# Patient Record
Sex: Female | Born: 1950 | Hispanic: No | Smoking: Never smoker
Health system: Southern US, Community
[De-identification: ages and names within clinical notes are randomized; demographics above are authoritative.]

---

## 2016-03-04 DIAGNOSIS — R05 Cough: Secondary | ICD-10-CM | POA: Diagnosis not present

## 2018-02-18 ENCOUNTER — Observation Stay (HOSPITAL_COMMUNITY)
Admission: EM | Admit: 2018-02-18 | Discharge: 2018-02-20 | Disposition: A | Discharge status: Home / Self Care | Payer: Medicare Other | Attending: Surgery | Admitting: Surgery

## 2018-02-18 ENCOUNTER — Emergency Department (HOSPITAL_COMMUNITY): Payer: Medicare Other

## 2018-02-18 ENCOUNTER — Other Ambulatory Visit: Payer: Self-pay

## 2018-02-18 ENCOUNTER — Encounter: Payer: Self-pay | Admitting: Emergency Medicine

## 2018-02-18 DIAGNOSIS — K8010 Calculus of gallbladder with chronic cholecystitis without obstruction: Principal | ICD-10-CM | POA: Insufficient documentation

## 2018-02-18 DIAGNOSIS — R1011 Right upper quadrant pain: Secondary | ICD-10-CM | POA: Diagnosis not present

## 2018-02-18 DIAGNOSIS — R109 Unspecified abdominal pain: Secondary | ICD-10-CM | POA: Diagnosis not present

## 2018-02-18 DIAGNOSIS — R1010 Upper abdominal pain, unspecified: Secondary | ICD-10-CM | POA: Diagnosis not present

## 2018-02-18 DIAGNOSIS — K8001 Calculus of gallbladder with acute cholecystitis with obstruction: Secondary | ICD-10-CM

## 2018-02-18 DIAGNOSIS — K8000 Calculus of gallbladder with acute cholecystitis without obstruction: Secondary | ICD-10-CM | POA: Diagnosis present

## 2018-02-18 DIAGNOSIS — K810 Acute cholecystitis: Secondary | ICD-10-CM | POA: Diagnosis present

## 2018-02-18 DIAGNOSIS — K819 Cholecystitis, unspecified: Secondary | ICD-10-CM

## 2018-02-18 DIAGNOSIS — K8020 Calculus of gallbladder without cholecystitis without obstruction: Secondary | ICD-10-CM | POA: Diagnosis not present

## 2018-02-18 LAB — COMPREHENSIVE METABOLIC PANEL
ALT: 144 U/L — ABNORMAL HIGH (ref 0–44)
AST: 94 U/L — ABNORMAL HIGH (ref 15–41)
Albumin: 3.8 g/dL (ref 3.5–5.0)
Alkaline Phosphatase: 153 U/L — ABNORMAL HIGH (ref 38–126)
Anion gap: 9 (ref 5–15)
BUN: 14 mg/dL (ref 8–23)
CO2: 26 mmol/L (ref 22–32)
Calcium: 8.8 mg/dL — ABNORMAL LOW (ref 8.9–10.3)
Chloride: 103 mmol/L (ref 98–111)
Creatinine, Ser: 0.72 mg/dL (ref 0.44–1.00)
GFR calc Af Amer: >60 mL/min (ref >60)
Glucose, Bld: 100 mg/dL — ABNORMAL HIGH (ref 70–99)
Potassium: 3.7 mmol/L (ref 3.5–5.1)
Sodium: 138 mmol/L (ref 135–145)
Total Bilirubin: 0.8 mg/dL (ref 0.3–1.2)
Total Protein: 7.4 g/dL (ref 6.5–8.1)

## 2018-02-18 LAB — URINALYSIS, ROUTINE W REFLEX MICROSCOPIC
Bacteria, UA: NONE SEEN
Bilirubin Urine: NEGATIVE
Glucose, UA: NEGATIVE mg/dL
Ketones, ur: NEGATIVE mg/dL
Nitrite: NEGATIVE
Protein, ur: NEGATIVE mg/dL
SPECIFIC GRAVITY, URINE: 1.021 (ref 1.005–1.030)
pH: 5 (ref 5.0–8.0)

## 2018-02-18 LAB — CBC
HCT: 39.1 % (ref 36.0–46.0)
Hemoglobin: 12.2 g/dL (ref 12.0–15.0)
MCH: 28.9 pg (ref 26.0–34.0)
MCHC: 31.2 g/dL (ref 30.0–36.0)
MCV: 92.7 fL (ref 80.0–100.0)
Platelets: 215 10*3/uL (ref 150–400)
RBC: 4.22 MIL/uL (ref 3.87–5.11)
RDW: 12.9 % (ref 11.5–15.5)
WBC: 8.1 10*3/uL (ref 4.0–10.5)
nRBC: 0 % (ref 0.0–0.2)

## 2018-02-18 LAB — LIPASE, BLOOD: Lipase: 34 U/L (ref 11–51)

## 2018-02-18 MED ORDER — HYDROCODONE-ACETAMINOPHEN 5-325 MG PO TABS: 1.0000 | ORAL_TABLET | ORAL | Status: DC | PRN

## 2018-02-18 MED ORDER — KETOROLAC TROMETHAMINE 30 MG/ML IJ SOLN
30.0000 mg | Freq: Once | INTRAMUSCULAR | Status: AC
  Administered 2018-02-18: 30 mg via INTRAVENOUS
  Filled 2018-02-18: qty 1

## 2018-02-18 MED ORDER — MORPHINE SULFATE (PF) 4 MG/ML IV SOLN
4.0000 mg | Freq: Once | INTRAVENOUS | Status: DC
  Filled 2018-02-18 (×2): qty 1

## 2018-02-18 MED ORDER — ACETAMINOPHEN 650 MG RE SUPP: 650.0000 mg | Freq: Four times a day (QID) | RECTAL | Status: DC | PRN

## 2018-02-18 MED ORDER — ACETAMINOPHEN 325 MG PO TABS: 650.0000 mg | ORAL_TABLET | Freq: Four times a day (QID) | ORAL | Status: DC | PRN

## 2018-02-18 MED ORDER — ONDANSETRON HCL 4 MG/2ML IJ SOLN
4.0000 mg | Freq: Once | INTRAMUSCULAR | Status: AC
  Administered 2018-02-18: 4 mg via INTRAVENOUS
  Filled 2018-02-18 (×3): qty 2

## 2018-02-18 MED ORDER — KCL IN DEXTROSE-NACL 20-5-0.45 MEQ/L-%-% IV SOLN
INTRAVENOUS | Status: DC
  Administered 2018-02-18: via INTRAVENOUS
  Filled 2018-02-18: qty 1000

## 2018-02-18 MED ORDER — ONDANSETRON HCL 4 MG/2ML IJ SOLN: 4.0000 mg | Freq: Four times a day (QID) | INTRAMUSCULAR | Status: DC | PRN

## 2018-02-18 MED ORDER — SODIUM CHLORIDE 0.9% FLUSH
3.0000 mL | Freq: Once | INTRAVENOUS | Status: AC
  Administered 2018-02-18: 3 mL via INTRAVENOUS

## 2018-02-18 MED ORDER — SODIUM CHLORIDE 0.9 % IV SOLN
2.0000 g | Freq: Every day | INTRAVENOUS | Status: DC
  Administered 2018-02-19: 2 g via INTRAVENOUS
  Filled 2018-02-18: qty 2
  Filled 2018-02-18: qty 20

## 2018-02-18 MED ORDER — ONDANSETRON 4 MG PO TBDP: 4.0000 mg | ORAL_TABLET | Freq: Four times a day (QID) | ORAL | Status: DC | PRN

## 2018-02-18 MED ORDER — HYDROMORPHONE HCL 1 MG/ML IJ SOLN: 1.0000 mg | INTRAMUSCULAR | Status: DC | PRN

## 2018-02-18 NOTE — ED Provider Notes (Signed)
[facility] DEPT Provider Note   CSN: [id number] Arrival date & time: 02/18/18  1715     History   Chief Complaint Chief Complaint  Patient presents with  . Abdominal Pain    HPI Daisy Page is a 68 y.o. female presents sent from her PCP for evaluation of acute onset, progressively worsening right-sided abdominal pain for 3 days.  Reports sharp stabbing pain which worsens with any deep inspiration, cough, or movements.  Pain does not radiate.  Has had nausea and vomiting in the past couple of days.  Reports that nausea and vomiting is not unusual for her as she sometimes "gets backed up".  Last bowel movement was earlier today and was normal for her.  Denies urinary symptoms, melena, hematochezia, diarrhea.  Has been taking ibuprofen without relief of her symptoms.  Also notes URI symptoms and nonproductive cough for the last week or so which has been improving.  Went to [facility] and was sent to the ED for rule out of cholecystitis.  The history is provided by the patient.    History reviewed. No pertinent past medical history.  Patient Active Problem List   Diagnosis Date Noted  . Acute calculous cholecystitis 02/18/2018  . Acute cholecystitis 02/18/2018    History reviewed. No pertinent surgical history.   OB History   No obstetric history on file.      Home Medications    Prior to Admission medications   Not on File    Family History Family History  Problem Relation Age of Onset  . Thyroid disease Mother   . Diabetes Mother   . Diabetes Father   . Hypertension Father     Social History Social History   Tobacco Use  . Smoking status: Never Smoker  . Smokeless tobacco: Never Used  Substance Use Topics  . Alcohol use: Never    Frequency: Never  . Drug use: Never     Allergies   Patient has no known allergies.   Review of Systems Review of Systems  Constitutional: Negative for chills and fever.    Respiratory: Positive for cough. Negative for shortness of breath.   Cardiovascular: Negative for chest pain.  Gastrointestinal: Positive for abdominal pain, nausea and vomiting. Negative for blood in stool and diarrhea.  Genitourinary: Negative for dysuria, frequency, hematuria and urgency.  All other systems reviewed and are negative.    Physical Exam Updated Vital Signs BP (!) 148/72 (BP Location: Right Arm)   Pulse 70   Temp 98.1 F (36.7 C) (Oral)   Resp 20   Ht _0  (1.549 m)   Wt 91 kg   SpO2 97%   BMI 37.90 kg/m   Physical Exam Vitals signs and nursing note reviewed.  Constitutional:      General: She is not in acute distress.    Appearance: She is well-developed.  HENT:     Head: Normocephalic and atraumatic.  Eyes:     General:        Right eye: No discharge.        Left eye: No discharge.     Conjunctiva/sclera: Conjunctivae normal.  Neck:     Vascular: No JVD.     Trachea: No tracheal deviation.  Cardiovascular:     Rate and Rhythm: Normal rate and regular rhythm.  Pulmonary:     Effort: Pulmonary effort is normal.     Breath sounds: Normal breath sounds.  Chest:     Chest wall:  No tenderness.  Abdominal:     General: There is no distension.     Palpations: Abdomen is soft.     Tenderness: There is abdominal tenderness in the right upper quadrant. There is guarding. There is no right CVA tenderness, left CVA tenderness or rebound. Positive signs include Murphy's sign. Negative signs include Rovsing's sign and McBurney's sign.     Comments: Right flank tender to palpation  Skin:    General: Skin is warm and dry.     Findings: No erythema.  Neurological:     Mental Status: She is alert.  Psychiatric:        Behavior: Behavior normal.      ED Treatments / Results  Labs (all labs ordered are listed, but only abnormal results are displayed) Labs Reviewed  COMPREHENSIVE METABOLIC PANEL - Abnormal; Notable for the following components:       Result Value   Glucose, Bld 100 (*)    Calcium 8.8 (*)    AST 94 (*)    ALT 144 (*)    Alkaline Phosphatase 153 (*)    All other components within normal limits  URINALYSIS, ROUTINE W REFLEX MICROSCOPIC - Abnormal; Notable for the following components:   APPearance HAZY (*)    Hgb urine dipstick SMALL (*)    Leukocytes, UA TRACE (*)    All other components within normal limits  MRSA PCR SCREENING  LIPASE, BLOOD  CBC  HIV ANTIBODY (ROUTINE TESTING W REFLEX)  COMPREHENSIVE METABOLIC PANEL  CBC  PROTIME-INR    EKG None  Radiology Dg Chest 2 View  Result Date: 02/18/2018 CLINICAL DATA:  Upper abdominal pain for 3 days. EXAM: CHEST - 2 VIEW COMPARISON:  None. FINDINGS: Cardiomediastinal silhouette is normal. No pleural effusions or focal consolidations. Trachea projects midline and there is no pneumothorax. Soft tissue planes and included osseous structures are non-suspicious. Mild thoracic spondylosis and scoliosis. IMPRESSION: No acute cardiopulmonary process. Electronically Signed   By: Elon Alas M.D.   On: 02/18/2018 19:15   US Abdomen Limited Ruq  Result Date: 02/18/2018 CLINICAL DATA:  Right upper quadrant abdominal pain. EXAM: ULTRASOUND ABDOMEN LIMITED RIGHT UPPER QUADRANT COMPARISON:  None. FINDINGS: Gallbladder: Multiple stones within the gallbladder lumen. Gallbladder wall thickening. Small amount of pericholecystic fluid. Focal area of ring down artifact. Common bile duct: Diameter: 3 mm Liver: No focal lesion identified. Within normal limits in parenchymal echogenicity. Portal vein is patent on color Doppler imaging with normal direction of blood flow towards the liver. IMPRESSION: Cholelithiasis. Mild gallbladder wall thickening. Acute cholecystitis not excluded. Consider further evaluation with HIDA scan. Electronically Signed   By: Lovey Newcomer M.D.   On: 02/18/2018 18:56    Procedures Procedures (including critical care time)  Medications Ordered in  ED Medications  dextrose 5 % and 0.45 % NaCl with KCl 20 mEq/L infusion ( Intravenous New Bag/Given 02/18/18 2347)  cefTRIAXone (ROCEPHIN) 2 g in sodium chloride 0.9 % 100 mL IVPB (has no administration in time range)  acetaminophen (TYLENOL) tablet 650 mg (has no administration in time range)    Or  acetaminophen (TYLENOL) suppository 650 mg (has no administration in time range)  HYDROcodone-acetaminophen (NORCO/VICODIN) 5-325 MG per tablet 1-2 tablet (has no administration in time range)  HYDROmorphone (DILAUDID) injection 1 mg (has no administration in time range)  ondansetron (ZOFRAN-ODT) disintegrating tablet 4 mg (has no administration in time range)    Or  ondansetron (ZOFRAN) injection 4 mg (has no administration in time range)  sodium  chloride flush (NS) 0.9 % injection 3 mL (3 mLs Intravenous Given 02/18/18 1938)  ondansetron (ZOFRAN) injection 4 mg (4 mg Intravenous Given 02/18/18 2027)  ketorolac (TORADOL) 30 MG/ML injection 30 mg (30 mg Intravenous Given 02/18/18 2027)     Initial Impression / Assessment and Plan / ED Course  I have reviewed the triage vital signs and the nursing notes.  Pertinent labs & imaging results that were available during my care of the patient were reviewed by me and considered in my medical decision making (see chart for details).     Patient with right upper quadrant abdominal pain, nausea vomiting presents sent from her primary care provider for evaluation and rule out of cholelithiasis.  She is afebrile, initially hypertensive in the ED with some improvement.  She is quite uncomfortable but nontoxic in appearance.  Positive Murphy's on examination.  Chest x-ray shows no acute cardiopulmonary disease.  Doubt pneumonia, PE, ACS/MI.  Lab work reviewed by me shows elevated LFTs with AST 94 ALT 144 alk phosphatase 153 no renal insufficiency.  UA does not suggest UTI or nephrolithiasis.  No leukocytosis.  Lipase within normal limits.  Right upper quadrant  ultrasound shows cholelithiasis with mild gallbladder wall thickening.  Concern for acute cholecystitis.  Doubt obstruction, perforation, appendicitis, or dissection.  Will give IV antibiotics in the ED.  Spoke with Dr. Harlow Asa with general surgery who agrees to assume care of patient and bring her into the hospital for further evaluation and management.  Final Clinical Impressions(s) / ED Diagnoses   Final diagnoses:  RUQ pain  Calculus of gallbladder with acute cholecystitis and obstruction    ED Discharge Orders    None       Debroah Baller 02/19/18 Prudence Davidson, MD 02/21/18 [phone]

## 2018-02-18 NOTE — ED Notes (Signed)
ED TO INPATIENT HANDOFF REPORT  Name/Age/Gender Daisy CobiaPamela S Page 68 y.o. female  Code Status    Code Status Orders  (From admission, onward)         Start     Ordered   02/18/18 2217  Full code  Continuous     02/18/18 2219        Code Status History    This patient has a current code status but no historical code status.      Home/SNF/Other Home  Chief Complaint rt side pain / [facility]  Level of Care/Admitting Diagnosis ED Disposition    ED Disposition Condition Comment   Admit  [facility] [[id number]]  Level of Care: Med-Surg [16]  Diagnosis: Acute cholecystitis [575.0.ICD-9-CM]  Admitting Physician: CCS, MD [[id number]]  Attending Physician: CCS, MD [[id number]]  Bed request comments: 5 west  PT Class (Do Not Modify): Observation [104]  PT Acc Code (Do Not Modify): Observation [10022]       Medical History History reviewed. No pertinent past medical history.  Allergies No Known Allergies  IV Location/Drains/Wounds Patient Lines/Drains/Airways Status   Active Line/Drains/Airways    Name:   Placement date:   Placement time:   Site:   Days:   Peripheral IV 02/18/18 Left Antecubital   02/18/18    1936    Antecubital   less than 1          Labs/Imaging Results for orders placed or performed during the hospital encounter of 02/18/18 (from the past 48 hour(s))  Urinalysis, Routine w reflex microscopic     Status: Abnormal   Collection Time: 02/18/18  6:05 PM  Result Value Ref Range   Color, Urine YELLOW YELLOW   APPearance HAZY (A) CLEAR   Specific Gravity, Urine 1.021 1.005 - 1.030   pH 5.0 5.0 - 8.0   Glucose, UA NEGATIVE NEGATIVE mg/dL   Hgb urine dipstick SMALL (A) NEGATIVE   Bilirubin Urine NEGATIVE NEGATIVE   Ketones, ur NEGATIVE NEGATIVE mg/dL   Protein, ur NEGATIVE NEGATIVE mg/dL   Nitrite NEGATIVE NEGATIVE   Leukocytes, UA TRACE (A) NEGATIVE   RBC / HPF 0-5 0 - 5 RBC/hpf   WBC, UA 0-5 0 - 5 WBC/hpf   Bacteria,  UA NONE SEEN NONE SEEN   Squamous Epithelial / LPF 0-5 0 - 5   Mucus PRESENT     Comment: Performed at [facility], [address] [place], [place] [address]  Lipase, blood     Status: None   Collection Time: 02/18/18  7:37 PM  Result Value Ref Range   Lipase 34 11 - 51 U/L    Comment: Performed at [facility], [address] [place], [place] [address]  Comprehensive metabolic panel     Status: Abnormal   Collection Time: 02/18/18  7:37 PM  Result Value Ref Range   Sodium 138 135 - 145 mmol/L   Potassium 3.7 3.5 - 5.1 mmol/L   Chloride 103 98 - 111 mmol/L   CO2 26 22 - 32 mmol/L   Glucose, Bld 100 (H) 70 - 99 mg/dL   BUN 14 8 - 23 mg/dL   Creatinine, Ser 0.980.72 0.44 - 1.00 mg/dL   Calcium 8.8 (L) 8.9 - 10.3 mg/dL   Total Protein 7.4 6.5 - 8.1 g/dL   Albumin 3.8 3.5 - 5.0 g/dL   AST 94 (H) 15 - 41 U/L   ALT 144 (H) 0 - 44 U/L  Alkaline Phosphatase 153 (H) 38 - 126 U/L   Total Bilirubin 0.8 0.3 - 1.2 mg/dL   GFR calc non Af Amer >60 >60 mL/min   GFR calc Af Amer >60 >60 mL/min   Anion gap 9 5 - 15    Comment: Performed at [facility], [address] [place], [place] [address]  CBC     Status: None   Collection Time: 02/18/18  7:37 PM  Result Value Ref Range   WBC 8.1 4.0 - 10.5 K/uL   RBC 4.22 3.87 - 5.11 MIL/uL   Hemoglobin 12.2 12.0 - 15.0 g/dL   HCT 21.339.1 08.636.0 - 57.846.0 %   MCV 92.7 80.0 - 100.0 fL   MCH 28.9 26.0 - 34.0 pg   MCHC 31.2 30.0 - 36.0 g/dL   RDW 46.912.9 62.911.5 - 52.815.5 %   Platelets 215 150 - 400 K/uL   nRBC 0.0 0.0 - 0.2 %    Comment: Performed at [facility], [address] [place], [place] [address]   Dg Chest 2 View  Result Date: 02/18/2018 CLINICAL DATA:  Upper abdominal pain for 3 days. EXAM: CHEST - 2 VIEW COMPARISON:  None. FINDINGS: Cardiomediastinal silhouette is normal. No pleural effusions or focal consolidations. Trachea projects midline and there is no pneumothorax. Soft  tissue planes and included osseous structures are non-suspicious. Mild thoracic spondylosis and scoliosis. IMPRESSION: No acute cardiopulmonary process. Electronically Signed   By: Awilda Metroourtnay  Bloomer M.D.   On: 02/18/2018 19:15   [place] Abdomen Limited Ruq  Result Date: 02/18/2018 CLINICAL DATA:  Right upper quadrant abdominal pain. EXAM: ULTRASOUND ABDOMEN LIMITED RIGHT UPPER QUADRANT COMPARISON:  None. FINDINGS: Gallbladder: Multiple stones within the gallbladder lumen. Gallbladder wall thickening. Small amount of pericholecystic fluid. Focal area of ring down artifact. Common bile duct: Diameter: 3 mm Liver: No focal lesion identified. Within normal limits in parenchymal echogenicity. Portal vein is patent on color Doppler imaging with normal direction of blood flow towards the liver. IMPRESSION: Cholelithiasis. Mild gallbladder wall thickening. Acute cholecystitis not excluded. Consider further evaluation with HIDA scan. Electronically Signed   By: Annia Beltrew  Davis M.D.   On: 02/18/2018 18:56    Pending Labs Unresulted Labs (From admission, onward)    Start     Ordered   02/19/18 0500  Comprehensive metabolic panel  Tomorrow morning,   R     02/18/18 2219   02/19/18 0500  CBC  Tomorrow morning,   R     02/18/18 2219   02/19/18 0500  Protime-INR  Tomorrow morning,   R     02/18/18 2219   02/18/18 2217  HIV antibody (Routine Testing)  Once,   R     02/18/18 2219          Vitals/Pain Today's Vitals   02/18/18 1723 02/18/18 1827 02/18/18 1930 02/18/18 2000  BP:  (!) 166/80  (!) 173/63  Pulse:  75  68  Resp:  18  17  Temp:      TempSrc:      SpO2:  95%  96%  PainSc: 10-Worst pain ever  9  9     Isolation Precautions No active isolations  Medications Medications  dextrose 5 % and 0.45 % NaCl with KCl 20 mEq/L infusion (has no administration in time range)  cefTRIAXone (ROCEPHIN) 2 g in sodium chloride 0.9 % 100 mL IVPB (has no administration in time range)  acetaminophen (TYLENOL) tablet  650 mg (has no administration in time range)  Or  acetaminophen (TYLENOL) suppository 650 mg (has no administration in time range)  HYDROcodone-acetaminophen (NORCO/VICODIN) 5-325 MG per tablet 1-2 tablet (has no administration in time range)  HYDROmorphone (DILAUDID) injection 1 mg (has no administration in time range)  ondansetron (ZOFRAN-ODT) disintegrating tablet 4 mg (has no administration in time range)    Or  ondansetron (ZOFRAN) injection 4 mg (has no administration in time range)  sodium chloride flush (NS) 0.9 % injection 3 mL (3 mLs Intravenous Given 02/18/18 1938)  ondansetron (ZOFRAN) injection 4 mg (4 mg Intravenous Given 02/18/18 2027)  ketorolac (TORADOL) 30 MG/ML injection 30 mg (30 mg Intravenous Given 02/18/18 2027)    Mobility walks

## 2018-02-18 NOTE — H&P (Signed)
Daisy Page is an 68 y.o. female.    General Surgery [facility]  Chief Complaint: abdominal pain, acute cholecystitis, cholelithiasis  HPI: Patient is a 68 year old female who presents to the emergency department on referral from her [facility] care [facility]'s [facility] for evaluation of right upper quadrant abdominal pain.  Patient presents with a 2-day history of lateral right upper quadrant abdominal pain.  She denies fevers or chills.  She denies nausea or vomiting.  She has had recent constipation.  Patient was evaluated in the emergency department.  Laboratory studies showed a normal white blood cell count of 8.1.  There was mild elevation of her transaminases.  Total bilirubin was normal.  Ultrasound exam showed multiple gallstones with thickening of the gallbladder wall and small pericholecystic fluid consistent with acute cholecystitis.  There was no biliary dilatation.  Patient denies any prior history of hepatobiliary disease.  There is no history of hepatitis.  There is no history of pancreatitis.  Patient has had no prior abdominal surgery.  General surgery is now consulted for recommendations for management.  Patient is accompanied by her son.  Primary care physician is Dr. Duane LopeAlan Ross.  History reviewed. No pertinent past medical history.  History reviewed. No pertinent surgical history.  Family History  Problem Relation Age of Onset  . Thyroid disease Mother   . Diabetes Mother   . Diabetes Father   . Hypertension Father    Social History:  reports that she has never smoked. She has never used smokeless tobacco. She reports that she does not drink alcohol or use drugs.  Allergies: Not on File  (Not in a hospital admission)   Results for orders placed or performed during the hospital encounter of 02/18/18 (from the past 48 hour(s))  Urinalysis, Routine w reflex microscopic     Status: Abnormal   Collection Time: 02/18/18  6:05 PM   Result Value Ref Range   Color, Urine YELLOW YELLOW   APPearance HAZY (A) CLEAR   Specific Gravity, Urine 1.021 1.005 - 1.030   pH 5.0 5.0 - 8.0   Glucose, UA NEGATIVE NEGATIVE mg/dL   Hgb urine dipstick SMALL (A) NEGATIVE   Bilirubin Urine NEGATIVE NEGATIVE   Ketones, ur NEGATIVE NEGATIVE mg/dL   Protein, ur NEGATIVE NEGATIVE mg/dL   Nitrite NEGATIVE NEGATIVE   Leukocytes, UA TRACE (A) NEGATIVE   RBC / HPF 0-5 0 - 5 RBC/hpf   WBC, UA 0-5 0 - 5 WBC/hpf   Bacteria, UA NONE SEEN NONE SEEN   Squamous Epithelial / LPF 0-5 0 - 5   Mucus PRESENT     Comment: Performed at [facility], [address] [place], [place] [address]  Lipase, blood     Status: None   Collection Time: 02/18/18  7:37 PM  Result Value Ref Range   Lipase 34 11 - 51 U/L    Comment: Performed at [facility], [address] [place], [place] [address]  Comprehensive metabolic panel     Status: Abnormal   Collection Time: 02/18/18  7:37 PM  Result Value Ref Range   Sodium 138 135 - 145 mmol/L   Potassium 3.7 3.5 - 5.1 mmol/L   Chloride 103 98 - 111 mmol/L   CO2 26 22 - 32 mmol/L   Glucose, Bld 100 (H) 70 - 99 mg/dL   BUN 14 8 - 23 mg/dL   Creatinine, Ser 2.130.72 0.44 - 1.00 mg/dL   Calcium  8.8 (L) 8.9 - 10.3 mg/dL   Total Protein 7.4 6.5 - 8.1 g/dL   Albumin 3.8 3.5 - 5.0 g/dL   AST 94 (H) 15 - 41 U/L   ALT 144 (H) 0 - 44 U/L   Alkaline Phosphatase 153 (H) 38 - 126 U/L   Total Bilirubin 0.8 0.3 - 1.2 mg/dL   GFR calc non Af Amer >60 >60 mL/min   GFR calc Af Amer >60 >60 mL/min   Anion gap 9 5 - 15    Comment: Performed at [facility], [address] [place], [place] [address]  CBC     Status: None   Collection Time: 02/18/18  7:37 PM  Result Value Ref Range   WBC 8.1 4.0 - 10.5 K/uL   RBC 4.22 3.87 - 5.11 MIL/uL   Hemoglobin 12.2 12.0 - 15.0 g/dL   HCT 19.139.1 47.836.0 - 29.546.0 %   MCV 92.7 80.0 - 100.0 fL   MCH 28.9 26.0 - 34.0 pg   MCHC 31.2 30.0 -  36.0 g/dL   RDW 62.112.9 30.811.5 - 65.715.5 %   Platelets 215 150 - 400 K/uL   nRBC 0.0 0.0 - 0.2 %    Comment: Performed at [facility], [address] [place], [place] [address]   Dg Chest 2 View  Result Date: 02/18/2018 CLINICAL DATA:  Upper abdominal pain for 3 days. EXAM: CHEST - 2 VIEW COMPARISON:  None. FINDINGS: Cardiomediastinal silhouette is normal. No pleural effusions or focal consolidations. Trachea projects midline and there is no pneumothorax. Soft tissue planes and included osseous structures are non-suspicious. Mild thoracic spondylosis and scoliosis. IMPRESSION: No acute cardiopulmonary process. Electronically Signed   By: Awilda Metroourtnay  Bloomer M.D.   On: 02/18/2018 19:15   [place] Abdomen Limited Ruq  Result Date: 02/18/2018 CLINICAL DATA:  Right upper quadrant abdominal pain. EXAM: ULTRASOUND ABDOMEN LIMITED RIGHT UPPER QUADRANT COMPARISON:  None. FINDINGS: Gallbladder: Multiple stones within the gallbladder lumen. Gallbladder wall thickening. Small amount of pericholecystic fluid. Focal area of ring down artifact. Common bile duct: Diameter: 3 mm Liver: No focal lesion identified. Within normal limits in parenchymal echogenicity. Portal vein is patent on color Doppler imaging with normal direction of blood flow towards the liver. IMPRESSION: Cholelithiasis. Mild gallbladder wall thickening. Acute cholecystitis not excluded. Consider further evaluation with HIDA scan. Electronically Signed   By: Annia Beltrew  Davis M.D.   On: 02/18/2018 18:56    Review of Systems  Constitutional: Negative for chills, diaphoresis and fever.  HENT: Negative.   Eyes: Negative.   Respiratory: Negative.   Cardiovascular: Negative.   Gastrointestinal: Positive for abdominal pain (RUQ) and constipation. Negative for diarrhea, heartburn, nausea and vomiting.  Genitourinary: Negative.   Musculoskeletal: Negative.   Skin: Negative.   Neurological: Negative.   Endo/Heme/Allergies: Negative.    Psychiatric/Behavioral: Negative.     Blood pressure (!) 173/63, pulse 68, temperature 97.7 F (36.5 C), temperature source Oral, resp. rate 17, SpO2 96 %. Physical Exam  Constitutional: She is oriented to person, place, and time. She appears well-developed and well-nourished. No distress.  HENT:  Head: Normocephalic and atraumatic.  Right Ear: External ear normal.  Left Ear: External ear normal.  Mouth/Throat: No oropharyngeal exudate.  Eyes: Pupils are equal, round, and reactive to light. Conjunctivae are normal. No scleral icterus.  Neck: Normal range of motion. Neck supple. No tracheal deviation present. No thyromegaly present.  Cardiovascular: Normal rate, regular rhythm and normal heart sounds.  No murmur heard. Respiratory: Effort  normal and breath sounds normal. No respiratory distress. She has no wheezes.  GI: Soft. Bowel sounds are normal. She exhibits no distension and no mass. There is abdominal tenderness (mild, lateral RUQ). There is no rebound and no guarding.  Musculoskeletal: Normal range of motion.        General: No deformity or edema.  Neurological: She is alert and oriented to person, place, and time.  Skin: Skin is warm and dry. She is not diaphoretic.  Psychiatric: She has a normal mood and affect. Her behavior is normal.     Assessment/Plan Acute cholecystitis, cholelithiasis, elevated liver function test  Patient will be admitted to the general surgery service.  She is eating a light dinner in the emergency department.  We will make her n.p.o. after midnight in case there is a possibility of proceeding with cholecystectomy tomorrow.  We will plan to repeat her laboratory studies in the morning and check her liver function test.  If there is continued elevation, she may require consultation from gastroenterology for possible ERCP.  If her liver function test have normalized, we will look for an opportunity to get her to the operating room for cholecystectomy.  We  discussed this plan tonight in the emergency department.  We discussed cholecystectomy during this hospitalization.  Patient understands and wishes to proceed.  Plan to start empiric antibiotics.  Darnell Level, MD [facility] Surgery Office: [phone]    Darnell Level, MD 02/18/2018, 9:54 PM

## 2018-02-18 NOTE — ED Notes (Signed)
Provided patient with turkey sandwich and ginger ale

## 2018-02-18 NOTE — ED Notes (Signed)
Pt ambulated to restroom with no assist. Gait steady. No issues observed.

## 2018-02-18 NOTE — ED Notes (Addendum)
US at bedside

## 2018-02-18 NOTE — ED Triage Notes (Signed)
Patient arrived via POV.   Patient sent over by [facility] for possible gall stones   C/O of right upper side abdomina pain 9tender on palpation) x3 days ago. Denies n/v or diarrhea today.   10/10 intermittent   A/Ox4  Ambulatory in triage.      Recent Cold and problems with digestive system per patient. Father died last month and has had some anxiety. Vomited last week.

## 2018-02-19 ENCOUNTER — Observation Stay (HOSPITAL_COMMUNITY): Payer: Medicare Other | Admitting: Certified Registered"

## 2018-02-19 ENCOUNTER — Encounter (HOSPITAL_COMMUNITY)
Admission: EM | Disposition: A | Discharge status: Home / Self Care | Payer: Self-pay | Source: Home / Self Care | Attending: Emergency Medicine

## 2018-02-19 ENCOUNTER — Observation Stay (HOSPITAL_COMMUNITY): Payer: Medicare Other

## 2018-02-19 DIAGNOSIS — K8000 Calculus of gallbladder with acute cholecystitis without obstruction: Secondary | ICD-10-CM | POA: Diagnosis not present

## 2018-02-19 DIAGNOSIS — K810 Acute cholecystitis: Secondary | ICD-10-CM | POA: Diagnosis not present

## 2018-02-19 DIAGNOSIS — K8010 Calculus of gallbladder with chronic cholecystitis without obstruction: Secondary | ICD-10-CM | POA: Diagnosis not present

## 2018-02-19 HISTORY — PX: CHOLECYSTECTOMY: SHX55

## 2018-02-19 LAB — COMPREHENSIVE METABOLIC PANEL
ALK PHOS: 125 U/L (ref 38–126)
ALT: 104 U/L — ABNORMAL HIGH (ref 0–44)
ANION GAP: 6 (ref 5–15)
AST: 62 U/L — ABNORMAL HIGH (ref 15–41)
Albumin: 3.2 g/dL — ABNORMAL LOW (ref 3.5–5.0)
BUN: 15 mg/dL (ref 8–23)
CALCIUM: 8.3 mg/dL — AB (ref 8.9–10.3)
CO2: 27 mmol/L (ref 22–32)
Chloride: 105 mmol/L (ref 98–111)
Creatinine, Ser: 0.75 mg/dL (ref 0.44–1.00)
GFR calc Af Amer: >60 mL/min (ref >60)
GFR calc non Af Amer: >60 mL/min (ref >60)
Glucose, Bld: 124 mg/dL — ABNORMAL HIGH (ref 70–99)
POTASSIUM: 3.8 mmol/L (ref 3.5–5.1)
Sodium: 138 mmol/L (ref 135–145)
TOTAL PROTEIN: 6.6 g/dL (ref 6.5–8.1)
Total Bilirubin: 0.2 mg/dL — ABNORMAL LOW (ref 0.3–1.2)

## 2018-02-19 LAB — HIV ANTIBODY (ROUTINE TESTING W REFLEX): HIV Screen 4th Generation wRfx: NONREACTIVE

## 2018-02-19 LAB — CBC
HCT: 37 % (ref 36.0–46.0)
Hemoglobin: 11 g/dL — ABNORMAL LOW (ref 12.0–15.0)
MCH: 27.8 pg (ref 26.0–34.0)
MCHC: 29.7 g/dL — AB (ref 30.0–36.0)
MCV: 93.7 fL (ref 80.0–100.0)
Platelets: 214 10*3/uL (ref 150–400)
RBC: 3.95 MIL/uL (ref 3.87–5.11)
RDW: 12.8 % (ref 11.5–15.5)
WBC: 6.6 10*3/uL (ref 4.0–10.5)
nRBC: 0 % (ref 0.0–0.2)

## 2018-02-19 LAB — PROTIME-INR
INR: 1.05
Prothrombin Time: 13.6 seconds (ref 11.4–15.2)

## 2018-02-19 LAB — MRSA PCR SCREENING: MRSA BY PCR: NEGATIVE

## 2018-02-19 SURGERY — LAPAROSCOPIC CHOLECYSTECTOMY
Anesthesia: General | Site: Abdomen

## 2018-02-19 MED ORDER — ACETAMINOPHEN 650 MG RE SUPP: 650.0000 mg | Freq: Four times a day (QID) | RECTAL | Status: DC | PRN

## 2018-02-19 MED ORDER — FENTANYL CITRATE (PF) 250 MCG/5ML IJ SOLN
INTRAMUSCULAR | Status: DC | PRN
  Administered 2018-02-19: 100 ug via INTRAVENOUS
  Administered 2018-02-19 (×3): 50 ug via INTRAVENOUS

## 2018-02-19 MED ORDER — BUPIVACAINE HCL (PF) 0.25 % IJ SOLN
INTRAMUSCULAR | Status: DC | PRN
  Administered 2018-02-19: 30 mL

## 2018-02-19 MED ORDER — IOPAMIDOL (ISOVUE-300) INJECTION 61%
INTRAVENOUS | Status: AC
  Filled 2018-02-19: qty 50

## 2018-02-19 MED ORDER — ONDANSETRON 4 MG PO TBDP: 4.0000 mg | ORAL_TABLET | Freq: Four times a day (QID) | ORAL | Status: DC | PRN

## 2018-02-19 MED ORDER — SODIUM CHLORIDE 0.9 % IV SOLN
2.0000 g | INTRAVENOUS | Status: DC
  Filled 2018-02-19: qty 20

## 2018-02-19 MED ORDER — ONDANSETRON HCL 4 MG/2ML IJ SOLN
INTRAMUSCULAR | Status: AC
  Filled 2018-02-19: qty 2

## 2018-02-19 MED ORDER — SODIUM CHLORIDE 0.9 % IV SOLN
2.0000 g | INTRAVENOUS | Status: DC
  Administered 2018-02-19: 2 g via INTRAVENOUS
  Filled 2018-02-19: qty 2
  Filled 2018-02-19: qty 20

## 2018-02-19 MED ORDER — BUPIVACAINE HCL (PF) 0.25 % IJ SOLN
INTRAMUSCULAR | Status: AC
  Filled 2018-02-19: qty 30

## 2018-02-19 MED ORDER — FENTANYL CITRATE (PF) 250 MCG/5ML IJ SOLN
INTRAMUSCULAR | Status: AC
  Filled 2018-02-19: qty 5

## 2018-02-19 MED ORDER — HYDROMORPHONE HCL 1 MG/ML IJ SOLN
1.0000 mg | INTRAMUSCULAR | Status: DC | PRN
  Administered 2018-02-19 (×2): 1 mg via INTRAVENOUS
  Filled 2018-02-19 (×2): qty 1

## 2018-02-19 MED ORDER — IOPAMIDOL (ISOVUE-300) INJECTION 61%
INTRAVENOUS | Status: DC | PRN
  Administered 2018-02-19: 4 mL

## 2018-02-19 MED ORDER — DEXAMETHASONE SODIUM PHOSPHATE 10 MG/ML IJ SOLN
INTRAMUSCULAR | Status: AC
  Filled 2018-02-19: qty 1

## 2018-02-19 MED ORDER — METRONIDAZOLE IN NACL 5-0.79 MG/ML-% IV SOLN
INTRAVENOUS | Status: AC
  Filled 2018-02-19: qty 100

## 2018-02-19 MED ORDER — ACETAMINOPHEN 325 MG PO TABS: 325.0000 mg | ORAL_TABLET | ORAL | Status: DC | PRN

## 2018-02-19 MED ORDER — FENTANYL CITRATE (PF) 100 MCG/2ML IJ SOLN
INTRAMUSCULAR | Status: AC
  Filled 2018-02-19: qty 2

## 2018-02-19 MED ORDER — OXYCODONE HCL 5 MG/5ML PO SOLN: 5.0000 mg | Freq: Once | ORAL | Status: DC | PRN

## 2018-02-19 MED ORDER — SUGAMMADEX SODIUM 200 MG/2ML IV SOLN
INTRAVENOUS | Status: DC | PRN
  Administered 2018-02-19: 180 mg via INTRAVENOUS

## 2018-02-19 MED ORDER — CHLORHEXIDINE GLUCONATE CLOTH 2 % EX PADS: 6.0000 | MEDICATED_PAD | Freq: Once | CUTANEOUS | Status: DC

## 2018-02-19 MED ORDER — DEXAMETHASONE SODIUM PHOSPHATE 10 MG/ML IJ SOLN
INTRAMUSCULAR | Status: DC | PRN
  Administered 2018-02-19: 4 mg via INTRAVENOUS

## 2018-02-19 MED ORDER — LACTATED RINGERS IR SOLN
Status: DC | PRN
  Administered 2018-02-19: 2000 mL

## 2018-02-19 MED ORDER — LIDOCAINE 2% (20 MG/ML) 5 ML SYRINGE
INTRAMUSCULAR | Status: AC
  Filled 2018-02-19: qty 5

## 2018-02-19 MED ORDER — MEPERIDINE HCL 50 MG/ML IJ SOLN: 6.2500 mg | INTRAMUSCULAR | Status: DC | PRN

## 2018-02-19 MED ORDER — 0.9 % SODIUM CHLORIDE (POUR BTL) OPTIME
TOPICAL | Status: DC | PRN
  Administered 2018-02-19: 1000 mL

## 2018-02-19 MED ORDER — SUGAMMADEX SODIUM 200 MG/2ML IV SOLN
INTRAVENOUS | Status: AC
  Filled 2018-02-19: qty 2

## 2018-02-19 MED ORDER — OXYCODONE HCL 5 MG PO TABS: 5.0000 mg | ORAL_TABLET | Freq: Once | ORAL | Status: DC | PRN

## 2018-02-19 MED ORDER — SODIUM CHLORIDE 0.9 % IV SOLN
INTRAVENOUS | Status: AC
  Filled 2018-02-19: qty 20

## 2018-02-19 MED ORDER — ONDANSETRON HCL 4 MG/2ML IJ SOLN
INTRAMUSCULAR | Status: DC | PRN
  Administered 2018-02-19: 4 mg via INTRAVENOUS

## 2018-02-19 MED ORDER — ROCURONIUM BROMIDE 10 MG/ML (PF) SYRINGE
PREFILLED_SYRINGE | INTRAVENOUS | Status: DC | PRN
  Administered 2018-02-19: 50 mg via INTRAVENOUS

## 2018-02-19 MED ORDER — LACTATED RINGERS IV SOLN
INTRAVENOUS | Status: DC | PRN
  Administered 2018-02-19 (×2): via INTRAVENOUS

## 2018-02-19 MED ORDER — FENTANYL CITRATE (PF) 100 MCG/2ML IJ SOLN
25.0000 ug | INTRAMUSCULAR | Status: DC | PRN
  Administered 2018-02-19: 50 ug via INTRAVENOUS
  Administered 2018-02-19: 25 ug via INTRAVENOUS
  Administered 2018-02-19: 50 ug via INTRAVENOUS
  Administered 2018-02-19: 25 ug via INTRAVENOUS

## 2018-02-19 MED ORDER — LABETALOL HCL 5 MG/ML IV SOLN
INTRAVENOUS | Status: DC | PRN
  Administered 2018-02-19 (×2): 5 mg via INTRAVENOUS

## 2018-02-19 MED ORDER — ONDANSETRON HCL 4 MG/2ML IJ SOLN: 4.0000 mg | Freq: Once | INTRAMUSCULAR | Status: DC | PRN

## 2018-02-19 MED ORDER — KCL IN DEXTROSE-NACL 20-5-0.9 MEQ/L-%-% IV SOLN
INTRAVENOUS | Status: DC
  Administered 2018-02-19: 16:00:00 via INTRAVENOUS
  Filled 2018-02-19 (×2): qty 1000

## 2018-02-19 MED ORDER — HYDROCODONE-ACETAMINOPHEN 5-325 MG PO TABS
1.0000 | ORAL_TABLET | ORAL | Status: DC | PRN
  Administered 2018-02-20: 2 via ORAL
  Filled 2018-02-19: qty 2

## 2018-02-19 MED ORDER — ROCURONIUM BROMIDE 100 MG/10ML IV SOLN
INTRAVENOUS | Status: AC
  Filled 2018-02-19: qty 1

## 2018-02-19 MED ORDER — PROPOFOL 10 MG/ML IV BOLUS
INTRAVENOUS | Status: DC | PRN
  Administered 2018-02-19: 150 mg via INTRAVENOUS

## 2018-02-19 MED ORDER — ONDANSETRON HCL 4 MG/2ML IJ SOLN: 4.0000 mg | Freq: Four times a day (QID) | INTRAMUSCULAR | Status: DC | PRN

## 2018-02-19 MED ORDER — ACETAMINOPHEN 160 MG/5ML PO SOLN: 325.0000 mg | ORAL | Status: DC | PRN

## 2018-02-19 MED ORDER — PROPOFOL 10 MG/ML IV BOLUS
INTRAVENOUS | Status: AC
  Filled 2018-02-19: qty 20

## 2018-02-19 MED ORDER — LABETALOL HCL 5 MG/ML IV SOLN
INTRAVENOUS | Status: AC
  Filled 2018-02-19: qty 4

## 2018-02-19 MED ORDER — TRAMADOL HCL 50 MG PO TABS: 50.0000 mg | ORAL_TABLET | Freq: Four times a day (QID) | ORAL | Status: DC | PRN

## 2018-02-19 MED ORDER — METRONIDAZOLE IN NACL 5-0.79 MG/ML-% IV SOLN: 500.0000 mg | Freq: Three times a day (TID) | INTRAVENOUS | Status: DC

## 2018-02-19 MED ORDER — LIDOCAINE 2% (20 MG/ML) 5 ML SYRINGE
INTRAMUSCULAR | Status: DC | PRN
  Administered 2018-02-19: 80 mg via INTRAVENOUS

## 2018-02-19 MED ORDER — ACETAMINOPHEN 325 MG PO TABS: 650.0000 mg | ORAL_TABLET | Freq: Four times a day (QID) | ORAL | Status: DC | PRN

## 2018-02-19 SURGICAL SUPPLY — 36 items
ADH SKN CLS APL DERMABOND .7 (GAUZE/BANDAGES/DRESSINGS) ×1
APPLIER CLIP ROT 10 11.4 M/L (STAPLE) ×3
APR CLP MED LRG 11.4X10 (STAPLE) ×1
BAG SPEC RTRVL LRG 6X4 10 (ENDOMECHANICALS) ×1
CABLE HIGH FREQUENCY MONO STRZ (ELECTRODE) ×3 IMPLANT
CHLORAPREP W/TINT 26ML (MISCELLANEOUS) ×3 IMPLANT
CLIP APPLIE ROT 10 11.4 M/L (STAPLE) ×1 IMPLANT
CLOSURE WOUND 1/2 X4 (GAUZE/BANDAGES/DRESSINGS)
COVER MAYO STAND STRL (DRAPES) ×2 IMPLANT
COVER SURGICAL LIGHT HANDLE (MISCELLANEOUS) ×3 IMPLANT
COVER WAND RF STERILE (DRAPES) ×2 IMPLANT
DECANTER SPIKE VIAL GLASS SM (MISCELLANEOUS) ×3 IMPLANT
DERMABOND ADVANCED (GAUZE/BANDAGES/DRESSINGS) ×2
DERMABOND ADVANCED .7 DNX12 (GAUZE/BANDAGES/DRESSINGS) IMPLANT
DRAPE C-ARM 42X120 X-RAY (DRAPES) ×2 IMPLANT
ELECT REM PT RETURN 15FT ADLT (MISCELLANEOUS) ×3 IMPLANT
GLOVE SURG ORTHO 8.0 STRL STRW (GLOVE) ×3 IMPLANT
GOWN STRL REUS W/TWL XL LVL3 (GOWN DISPOSABLE) ×6 IMPLANT
HEMOSTAT SURGICEL 4X8 (HEMOSTASIS) IMPLANT
KIT BASIN OR (CUSTOM PROCEDURE TRAY) ×3 IMPLANT
PAD POSITIONING PINK XL (MISCELLANEOUS) IMPLANT
POUCH SPECIMEN RETRIEVAL 10MM (ENDOMECHANICALS) ×3 IMPLANT
SCISSORS LAP 5X35 DISP (ENDOMECHANICALS) ×3 IMPLANT
SET CHOLANGIOGRAPH MIX (MISCELLANEOUS) ×3 IMPLANT
SET IRRIG TUBING LAPAROSCOPIC (IRRIGATION / IRRIGATOR) ×3 IMPLANT
SET TUBE SMOKE EVAC HIGH FLOW (TUBING) ×3 IMPLANT
SLEEVE XCEL OPT CAN 5 100 (ENDOMECHANICALS) ×3 IMPLANT
STRIP CLOSURE SKIN 1/2X4 (GAUZE/BANDAGES/DRESSINGS) ×1 IMPLANT
SURGICEL SNOW 2X4 (HEMOSTASIS) ×2 IMPLANT
SUT VIC AB 4-0 PS2 27 (SUTURE) ×3 IMPLANT
TAPE CLOTH 4X10 WHT NS (GAUZE/BANDAGES/DRESSINGS) IMPLANT
TOWEL OR 17X26 10 PK STRL BLUE (TOWEL DISPOSABLE) ×3 IMPLANT
TRAY LAPAROSCOPIC (CUSTOM PROCEDURE TRAY) ×3 IMPLANT
TROCAR BLADELESS OPT 5 100 (ENDOMECHANICALS) ×3 IMPLANT
TROCAR XCEL BLUNT TIP 100MML (ENDOMECHANICALS) ×3 IMPLANT
TROCAR XCEL NON-BLD 11X100MML (ENDOMECHANICALS) ×3 IMPLANT

## 2018-02-19 NOTE — Progress Notes (Signed)
Assessment & Plan: Acute cholecystitis, cholelithiasis, elevated LFT's  Slight improvement in transaminases this AM, bili normal, WBC normal  Exam with persistent RUQ tenderness  Plan to proceed with lap chole this AM as time allows  The risks and benefits of the procedure have been discussed at length with the patient.  The patient understands the proposed procedure, potential alternative treatments, and the course of recovery to be expected.  All of the patient's questions have been answered at this time.  The patient wishes to proceed with surgery.        Daisy Levelodd Telesia Ates, MD       [facility]       Office: [phone]   Chief Complaint: Acute cholecystitis, cholelithiasis, elevated LFT's  Subjective: Patient in bed, sleeping, easily aroused.  Family at bedside.  Mild pain.  Objective: Vital signs in last 24 hours: Temp:  [97.7 F (36.5 C)-98.1 F (36.7 C)] 97.8 F (36.6 C) (02/08 0500) Pulse Rate:  [52-87] 52 (02/08 0500) Resp:  [14-20] 14 (02/08 0500) BP: (133-173)/(62-90) 133/65 (02/08 0500) SpO2:  [95 %-100 %] 98 % (02/08 0500) Weight:  [91 kg] 91 kg (02/07 2322) Last BM Date: 02/17/18  Intake/Output from previous day: 02/07 0701 - 02/08 0700 In: 509.4 [I.V.:409.4; IV Piggyback:100] Out: 200 [Urine:200] Intake/Output this shift: No intake/output data recorded.  Physical Exam: HEENT - sclerae clear, mucous membranes moist Neck - soft Chest - clear bilaterally Cor - RRR Abdomen - soft, protuberant; mild RUQ tenderness; no mass Ext - no edema, non-tender Neuro - alert & oriented, no focal deficits  Lab Results:  Recent Labs    02/18/18 1937 02/19/18 0425  WBC 8.1 6.6  HGB 12.2 11.0*  HCT 39.1 37.0  PLT 215 214   BMET Recent Labs    02/18/18 1937 02/19/18 0425  NA 138 138  K 3.7 3.8  CL 103 105  CO2 26 27  GLUCOSE 100* 124*  BUN 14 15  CREATININE 0.72 0.75  CALCIUM 8.8* 8.3*   PT/INR Recent Labs    02/19/18 0425    LABPROT 13.6  INR 1.05   Comprehensive Metabolic Panel:    Component Value Date/Time   NA 138 02/19/2018 0425   NA 138 02/18/2018 1937   K 3.8 02/19/2018 0425   K 3.7 02/18/2018 1937   CL 105 02/19/2018 0425   CL 103 02/18/2018 1937   CO2 27 02/19/2018 0425   CO2 26 02/18/2018 1937   BUN 15 02/19/2018 0425   BUN 14 02/18/2018 1937   CREATININE 0.75 02/19/2018 0425   CREATININE 0.72 02/18/2018 1937   GLUCOSE 124 (H) 02/19/2018 0425   GLUCOSE 100 (H) 02/18/2018 1937   CALCIUM 8.3 (L) 02/19/2018 0425   CALCIUM 8.8 (L) 02/18/2018 1937   AST 62 (H) 02/19/2018 0425   AST 94 (H) 02/18/2018 1937   ALT 104 (H) 02/19/2018 0425   ALT 144 (H) 02/18/2018 1937   ALKPHOS 125 02/19/2018 0425   ALKPHOS 153 (H) 02/18/2018 1937   BILITOT 0.2 (L) 02/19/2018 0425   BILITOT 0.8 02/18/2018 1937   PROT 6.6 02/19/2018 0425   PROT 7.4 02/18/2018 1937   ALBUMIN 3.2 (L) 02/19/2018 0425   ALBUMIN 3.8 02/18/2018 1937    Studies/Results: Dg Chest 2 View  Result Date: 02/18/2018 CLINICAL DATA:  Upper abdominal pain for 3 days. EXAM: CHEST - 2 VIEW COMPARISON:  None. FINDINGS: Cardiomediastinal silhouette is normal. No pleural effusions or focal consolidations. Trachea projects midline and  there is no pneumothorax. Soft tissue planes and included osseous structures are non-suspicious. Mild thoracic spondylosis and scoliosis. IMPRESSION: No acute cardiopulmonary process. Electronically Signed   By: Awilda Metroourtnay  Bloomer M.D.   On: 02/18/2018 19:15   [place] Abdomen Limited Ruq  Result Date: 02/18/2018 CLINICAL DATA:  Right upper quadrant abdominal pain. EXAM: ULTRASOUND ABDOMEN LIMITED RIGHT UPPER QUADRANT COMPARISON:  None. FINDINGS: Gallbladder: Multiple stones within the gallbladder lumen. Gallbladder wall thickening. Small amount of pericholecystic fluid. Focal area of ring down artifact. Common bile duct: Diameter: 3 mm Liver: No focal lesion identified. Within normal limits in parenchymal echogenicity. Portal  vein is patent on color Doppler imaging with normal direction of blood flow towards the liver. IMPRESSION: Cholelithiasis. Mild gallbladder wall thickening. Acute cholecystitis not excluded. Consider further evaluation with HIDA scan. Electronically Signed   By: Annia Beltrew  Davis M.D.   On: 02/18/2018 18:56      Daisy Page 02/19/2018  Patient ID: Daisy Page, female   DOB: 1950/11/27, 68 y.o.   MRN: [id number]

## 2018-02-19 NOTE — Anesthesia Postprocedure Evaluation (Signed)
Anesthesia Post Note  Patient: Daisy Page  Procedure(s) Performed: LAPAROSCOPIC CHOLECYSTECTOMY WITH IOC (N/A Abdomen)     Patient location during evaluation: PACU Anesthesia Type: General Level of consciousness: awake and alert Pain management: pain level controlled Vital Signs Assessment: post-procedure vital signs reviewed and stable Respiratory status: spontaneous breathing, nonlabored ventilation, respiratory function stable and patient connected to nasal cannula oxygen Cardiovascular status: blood pressure returned to baseline and stable Postop Assessment: no apparent nausea or vomiting Anesthetic complications: no    Last Vitals:  Vitals:   02/19/18 1450 02/19/18 1620  BP: (!) 178/78 (!) 188/79  Pulse: (!) 58 68  Resp: 18 20  Temp:  36.9 C  SpO2: 94% 96%    Last Pain:  Vitals:   02/19/18 1430  TempSrc:   PainSc: Asleep                 Brandice Busser

## 2018-02-19 NOTE — Anesthesia Procedure Notes (Signed)
Procedure Name: Intubation Date/Time: 02/19/2018 11:42 AM Performed by: Niel Hummer, CRNA Pre-anesthesia Checklist: Patient being monitored, Suction available, Emergency Drugs available and Patient identified Patient Re-evaluated:Patient Re-evaluated prior to induction Oxygen Delivery Method: Circle system utilized Preoxygenation: Pre-oxygenation with 100% oxygen Induction Type: IV induction Ventilation: Mask ventilation without difficulty and Oral airway inserted - appropriate to patient size Laryngoscope Size: Mac and 4 Grade View: Grade I Tube type: Oral Tube size: 7.0 mm Number of attempts: 1 Airway Equipment and Method: Stylet Placement Confirmation: ETT inserted through vocal cords under direct vision,  positive ETCO2 and breath sounds checked- equal and bilateral Secured at: 21 cm Tube secured with: Tape Dental Injury: Teeth and Oropharynx as per pre-operative assessment

## 2018-02-19 NOTE — Op Note (Signed)
Procedure Note  Pre-operative Diagnosis:  Acute cholecystitis, cholelithiasis, elevated LFT's  Post-operative Diagnosis:  same  Surgeon:  Darnell Levelodd Akito Boomhower, MD  Assistant:  Ovidio Kinavid Newman, MD   Procedure:  Laparoscopic cholecystectomy with intra-operative cholangiography  Anesthesia:  General  Estimated Blood Loss:  100 cc  Drains: none         Specimen: gallbladder to pathology  Indications:  Patient is a 68 year old female who presents to the emergency department on referral from her primary care physician's office at [facility] for evaluation of right upper quadrant abdominal pain.  Patient presents with a 2-day history of lateral right upper quadrant abdominal pain.  She denies fevers or chills.  She denies nausea or vomiting.  She has had recent constipation.  Patient was evaluated in the emergency department.  Laboratory studies showed a normal white blood cell count of 8.1.  There was mild elevation of her transaminases.  Total bilirubin was normal.  Ultrasound exam showed multiple gallstones with thickening of the gallbladder wall and small pericholecystic fluid consistent with acute cholecystitis.  There was no biliary dilatation.  Patient denies any prior history of hepatobiliary disease.  There is no history of hepatitis.  There is no history of pancreatitis.  Patient has had no prior abdominal surgery.  General surgery is now consulted for recommendations for management.  Procedure Details:  The patient was seen in the pre-op holding area. The risks, benefits, complications, treatment options, and expected outcomes were previously discussed with the patient. The patient agreed with the proposed plan and has signed the informed consent form.  The patient was transported to operating room # 1 at the [facility]. The patient was placed in the supine position on the operating room table. Following induction of general anesthesia, the abdomen was prepped and draped in the usual  aseptic fashion.  An incision was made in the skin near the umbilicus. The midline fascia was incised and the peritoneal cavity was entered and a Hasson cannula was introduced under direct vision. The cannula was secured with a 0-Vicryl pursestring suture. Pneumoperitoneum was established with carbon dioxide. Additional cannulae were introduced under direct vision along the right costal margin in the midline, mid-clavicular line, and anterior axillary line.   The gallbladder was identified and the fundus grasped and retracted cephalad.  The gallbladder was markedly thickened and inflamed.  There were dense omental adhesions to the gallbladder.  The gallbladder was aspirated with the trocar.  Adhesions were taken down bluntly and the electrocautery was utilized as needed, taking care not to involve any adjacent structures. The infundibulum was grasped and retracted laterally, exposing the peritoneum overlying the triangle of Calot. The peritoneum was incised and structures exposed with blunt dissection. The cystic duct was clearly identified, bluntly dissected circumferentially, and clipped at the neck of the gallbladder.  An incision was made in the cystic duct and the cholangiogram catheter introduced. The catheter was secured using an ligaclip.  Real-time cholangiography was performed using C-arm fluoroscopy.  There was rapid filling of a normal caliber common bile duct.  There was reflux of contrast into the left and right hepatic ductal systems.  There was free flow distally into the duodenum without filling defect or obstruction.  The catheter was removed from the peritoneal cavity.  The cystic duct was then ligated with ligaclips and divided. The cystic artery was identified, dissected circumferentially, ligated with ligaclips, and divided.  The gallbladder was dissected away from the gallbladder bed using the electrocautery for hemostasis. The  gallbladder was completely removed from the liver and  placed into an endocatch bag. The gallbladder was removed in the endocatch bag through the umbilical port site and submitted to pathology for review.  The right upper quadrant was irrigated and the gallbladder bed was inspected. Hemostasis was achieved with the electrocautery.  A sheet of Surgicel was placed in the gallbladder bed.  Cannulae were removed under direct vision and good hemostasis was noted. Pneumoperitoneum was released and the majority of the carbon dioxide evacuated. The umbilical wound was irrigated and the fascia was then closed with the pursestring suture.  Local anesthetic was infiltrated at all port sites. Skin incisions were closed with 4-0 Monocril subcuticular sutures and Dermabond was applied.  Instrument, sponge, and needle counts were correct at the conclusion of the case.  The patient was awakened from anesthesia and brought to the recovery room in stable condition.  The patient tolerated the procedure well.   Darnell Levelodd Travonta Gill, MD [facility] Office: [phone]

## 2018-02-19 NOTE — Transfer of Care (Signed)
Immediate Anesthesia Transfer of Care Note  Patient: Daisy Page  Procedure(s) Performed: LAPAROSCOPIC CHOLECYSTECTOMY WITH IOC (N/A Abdomen)  Patient Location: PACU  Anesthesia Type:General  Level of Consciousness: awake, alert  and oriented  Airway & Oxygen Therapy: Patient Spontanous Breathing and Patient connected to face mask oxygen  Post-op Assessment: Report given to RN and Post -op Vital signs reviewed and stable  Post vital signs: Reviewed and stable  Last Vitals:  Vitals Value Taken Time  BP    Temp    Pulse 58 02/19/2018  1:17 PM  Resp 21 02/19/2018  1:17 PM  SpO2 100 % 02/19/2018  1:17 PM  Vitals shown include unvalidated device data.  Last Pain:  Vitals:   02/19/18 0500  TempSrc: Oral  PainSc:          Complications: No apparent anesthesia complications

## 2018-02-19 NOTE — Anesthesia Preprocedure Evaluation (Signed)
Anesthesia Evaluation  Patient identified by MRN, date of birth, ID band Patient awake    Reviewed: Allergy & Precautions, H&P , NPO status , Patient's Chart, lab work & pertinent test results, reviewed documented beta blocker date and time   Airway Mallampati: II  TM Distance: >3 FB Neck ROM: full    Dental no notable dental hx.    Pulmonary neg pulmonary ROS,    Pulmonary exam normal breath sounds clear to auscultation       Cardiovascular Exercise Tolerance: Good negative cardio ROS   Rhythm:regular Rate:Normal     Neuro/Psych negative neurological ROS  negative psych ROS   GI/Hepatic negative GI ROS, Neg liver ROS,   Endo/Other  negative endocrine ROS  Renal/GU negative Renal ROS  negative genitourinary   Musculoskeletal   Abdominal   Peds  Hematology negative hematology ROS (+)   Anesthesia Other Findings   Reproductive/Obstetrics negative OB ROS                             Anesthesia Physical Anesthesia Plan  ASA: II and emergent  Anesthesia Plan: General   Post-op Pain Management:    Induction: Intravenous  PONV Risk Score and Plan: 3 and Treatment may vary due to age or medical condition, Ondansetron and Dexamethasone  Airway Management Planned: Oral ETT  Additional Equipment:   Intra-op Plan:   Post-operative Plan: Extubation in OR  Informed Consent: I have reviewed the patients History and Physical, chart, labs and discussed the procedure including the risks, benefits and alternatives for the proposed anesthesia with the patient or authorized representative who has indicated his/her understanding and acceptance.     Dental Advisory Given  Plan Discussed with: CRNA, Anesthesiologist and Surgeon  Anesthesia Plan Comments: (  )        Anesthesia Quick Evaluation

## 2018-02-20 ENCOUNTER — Encounter (HOSPITAL_COMMUNITY): Payer: Self-pay | Admitting: Surgery

## 2018-02-20 MED ORDER — TRAMADOL HCL 50 MG PO TABS: 50.0000 mg | ORAL_TABLET | Freq: Four times a day (QID) | ORAL | 0 refills | Status: AC | PRN

## 2018-02-20 NOTE — Discharge Summary (Signed)
Physician Discharge Summary [facility]  Patient ID: Daisy Page MRN: [id number] DOB/AGE: 12-Mar-1950 68 y.o.  Admit date: 02/18/2018 Discharge date: 02/20/2018  Admission Diagnoses:  Acute cholecystitis  Discharge Diagnoses:  Principal Problem:   Acute calculous cholecystitis Active Problems:   Acute cholecystitis   Discharged Condition: good  Hospital Course: Patient was admitted for observation following gallbladder surgery.  Post op course was uncomplicated.  Pain was well controlled.  Tolerated diet.  Patient was prepared for discharge home on POD#1.  Consults: None  Treatments: surgery: lap chole with IOC  Discharge Exam: Blood pressure (!) 166/77, pulse 60, temperature 98 F (36.7 C), temperature source Oral, resp. rate 16, height 5\' 1"  (1.549 m), weight 91 kg, SpO2 95 %. HEENT - clear Neck - soft Chest - clear bilaterally Cor - RRR Abd - soft without distension; wounds dry and intact with Dermabond  Disposition: Home  Discharge Instructions    Diet - low sodium heart healthy   Complete by:  As directed    Discharge instructions   Complete by:  As directed    [facility]  LAPAROSCOPIC SURGERY:  POST-OP INSTRUCTIONS  Always review your discharge instruction sheet given to you by the facility where your surgery was performed.  A prescription for pain medication may be given to you upon discharge.  Take your pain medication as prescribed.  If narcotic pain medicine is not needed, then you may take acetaminophen (Tylenol) or ibuprofen (Advil) as needed.  Take your usually prescribed medications unless otherwise directed.  If you need a refill on your pain medication, please contact your pharmacy.  They will contact our office to request authorization. Prescriptions will not be filled after 5 P.M. or on weekends.  You should follow a light diet the first few days after arrival home, such as soup and crackers or toast.   Be sure to include plenty of fluids daily.  Most patients will experience some swelling and bruising in the area of the incisions.  Ice packs will help.  Swelling and bruising can take several days to resolve.   It is common to experience some constipation after surgery.  Increasing fluid intake and taking a stool softener (such as Colace) will usually help or prevent this problem from occurring.  A mild laxative (Milk of Magnesia or Miralax) should be taken according to package instructions if there has been no bowel movement after 48 hours.  You will have steri-strips and a gauze dressing over your incisions.  You may remove the gauze bandage on the second day after surgery, and you may shower at that time.  Leave your steri-strips (small skin tapes) in place directly over the incision.  These strips should remain on the skin for 5-7 days and then be removed.  You may get them wet in the shower and pat them dry.  Any sutures or staples will be removed at the office during your follow-up visit.  ACTIVITIES:  You may resume regular (light) daily activities beginning the next day - such as daily self-care, walking, climbing stairs - gradually increasing activities as tolerated.  You may have sexual intercourse when it is comfortable.  Refrain from any heavy lifting or straining until approved by your doctor.  You may drive when you are no longer taking prescription pain medication, you can comfortably wear a seatbelt, and you can safely maneuver your car and apply brakes.  You should see your doctor in the office for  a follow-up appointment approximately 2-3 weeks after your surgery.  Make sure that you call for this appointment within a day or two after you arrive home to insure a convenient appointment time.  WHEN TO CALL YOUR DOCTOR: Fever over 101.0 Inability to urinate Continued bleeding from incision Increased pain, redness, or drainage from the incision Increasing abdominal pain  The  clinic staff is available to answer your questions during regular business hours.  Please don't hesitate to call and ask to speak to one of the nurses for clinical concerns.  If you have a medical emergency, go to the nearest emergency room or call 911.  A surgeon from [facility] is always on call for the hospital.  Velora Heckler, MD, [facility] Office: [phone] Toll Free:  [phone] FAX [phone]  Website: [email or web address]   Increase activity slowly   Complete by:  As directed    No dressing needed   Complete by:  As directed      Allergies as of 02/20/2018   No Known Allergies     Medication List    TAKE these medications   traMADol 50 MG tablet Commonly known as:  ULTRAM Take 1-2 tablets (50-100 mg total) by mouth every 6 (six) hours as needed.      Follow-up Information    [facility] [place]. Schedule an appointment as soon as possible for a visit in 2 week(s).   Specialty:  General Surgery Contact information: [address] [place] [address] [phone]          Velora Heckler, MD, [facility] Office: [phone]   Signed: Darnell Level 02/20/2018, 11:16 AM

## 2018-02-20 NOTE — Plan of Care (Signed)
Reviewed discharge instructions; copy given. IV removed. Patient ready for discharge.  

## 2018-02-20 NOTE — Plan of Care (Signed)
Patient lying in bed this morning. No complaints of pain at this time. Has ambulated in the hall yesterday several times and has been up in the room this morning. + flatus; no BM. Will continue to monitor.

## 2018-12-16 DIAGNOSIS — R1010 Upper abdominal pain, unspecified: Secondary | ICD-10-CM | POA: Diagnosis not present

## 2018-12-16 DIAGNOSIS — R635 Abnormal weight gain: Secondary | ICD-10-CM | POA: Diagnosis not present

## 2018-12-16 DIAGNOSIS — R8290 Unspecified abnormal findings in urine: Secondary | ICD-10-CM | POA: Diagnosis not present

## 2018-12-16 DIAGNOSIS — J3489 Other specified disorders of nose and nasal sinuses: Secondary | ICD-10-CM | POA: Diagnosis not present

## 2019-03-23 DIAGNOSIS — E039 Hypothyroidism, unspecified: Secondary | ICD-10-CM | POA: Diagnosis not present

## 2019-03-23 DIAGNOSIS — J309 Allergic rhinitis, unspecified: Secondary | ICD-10-CM | POA: Diagnosis not present

## 2019-03-23 DIAGNOSIS — R030 Elevated blood-pressure reading, without diagnosis of hypertension: Secondary | ICD-10-CM | POA: Diagnosis not present

## 2019-06-21 DIAGNOSIS — Z131 Encounter for screening for diabetes mellitus: Secondary | ICD-10-CM | POA: Diagnosis not present

## 2019-06-21 DIAGNOSIS — E039 Hypothyroidism, unspecified: Secondary | ICD-10-CM | POA: Diagnosis not present

## 2019-06-21 DIAGNOSIS — Z23 Encounter for immunization: Secondary | ICD-10-CM | POA: Diagnosis not present

## 2019-06-21 DIAGNOSIS — Z0000 Encounter for general adult medical examination without abnormal findings: Secondary | ICD-10-CM | POA: Diagnosis not present

## 2019-06-21 DIAGNOSIS — R030 Elevated blood-pressure reading, without diagnosis of hypertension: Secondary | ICD-10-CM | POA: Diagnosis not present

## 2019-06-21 DIAGNOSIS — Z13220 Encounter for screening for lipoid disorders: Secondary | ICD-10-CM | POA: Diagnosis not present

## 2019-06-21 DIAGNOSIS — Z136 Encounter for screening for cardiovascular disorders: Secondary | ICD-10-CM | POA: Diagnosis not present

## 2020-02-06 DIAGNOSIS — J40 Bronchitis, not specified as acute or chronic: Secondary | ICD-10-CM | POA: Diagnosis not present

## 2020-02-06 DIAGNOSIS — J019 Acute sinusitis, unspecified: Secondary | ICD-10-CM | POA: Diagnosis not present

## 2020-02-06 DIAGNOSIS — R059 Cough, unspecified: Secondary | ICD-10-CM | POA: Diagnosis not present

## 2020-05-06 DIAGNOSIS — Z1211 Encounter for screening for malignant neoplasm of colon: Secondary | ICD-10-CM | POA: Diagnosis not present

## 2020-05-06 DIAGNOSIS — R059 Cough, unspecified: Secondary | ICD-10-CM | POA: Diagnosis not present

## 2020-05-06 DIAGNOSIS — R109 Unspecified abdominal pain: Secondary | ICD-10-CM | POA: Diagnosis not present

## 2020-06-18 DIAGNOSIS — Z131 Encounter for screening for diabetes mellitus: Secondary | ICD-10-CM | POA: Diagnosis not present

## 2020-06-18 DIAGNOSIS — E039 Hypothyroidism, unspecified: Secondary | ICD-10-CM | POA: Diagnosis not present

## 2020-06-18 DIAGNOSIS — E781 Pure hyperglyceridemia: Secondary | ICD-10-CM | POA: Diagnosis not present

## 2020-06-24 DIAGNOSIS — E781 Pure hyperglyceridemia: Secondary | ICD-10-CM | POA: Diagnosis not present

## 2020-06-24 DIAGNOSIS — I868 Varicose veins of other specified sites: Secondary | ICD-10-CM | POA: Diagnosis not present

## 2020-06-24 DIAGNOSIS — R3915 Urgency of urination: Secondary | ICD-10-CM | POA: Diagnosis not present

## 2020-06-24 DIAGNOSIS — Z0000 Encounter for general adult medical examination without abnormal findings: Secondary | ICD-10-CM | POA: Diagnosis not present

## 2020-06-24 DIAGNOSIS — R7309 Other abnormal glucose: Secondary | ICD-10-CM | POA: Diagnosis not present

## 2020-06-24 DIAGNOSIS — E039 Hypothyroidism, unspecified: Secondary | ICD-10-CM | POA: Diagnosis not present

## 2020-06-24 DIAGNOSIS — M2010 Hallux valgus (acquired), unspecified foot: Secondary | ICD-10-CM | POA: Diagnosis not present

## 2020-06-24 DIAGNOSIS — Z23 Encounter for immunization: Secondary | ICD-10-CM | POA: Diagnosis not present

## 2020-07-01 ENCOUNTER — Other Ambulatory Visit: Payer: Self-pay | Admitting: Family Medicine

## 2020-07-01 DIAGNOSIS — Z1231 Encounter for screening mammogram for malignant neoplasm of breast: Secondary | ICD-10-CM

## 2020-07-01 DIAGNOSIS — E2839 Other primary ovarian failure: Secondary | ICD-10-CM

## 2020-07-03 ENCOUNTER — Other Ambulatory Visit: Payer: Self-pay

## 2020-07-03 ENCOUNTER — Ambulatory Visit (INDEPENDENT_AMBULATORY_CARE_PROVIDER_SITE_OTHER): Payer: Medicare Other | Admitting: Podiatry

## 2020-07-03 ENCOUNTER — Ambulatory Visit (INDEPENDENT_AMBULATORY_CARE_PROVIDER_SITE_OTHER): Payer: Medicare Other

## 2020-07-03 DIAGNOSIS — M21621 Bunionette of right foot: Secondary | ICD-10-CM | POA: Diagnosis not present

## 2020-07-03 DIAGNOSIS — M21622 Bunionette of left foot: Secondary | ICD-10-CM | POA: Diagnosis not present

## 2020-07-03 DIAGNOSIS — M21612 Bunion of left foot: Secondary | ICD-10-CM | POA: Diagnosis not present

## 2020-07-03 DIAGNOSIS — M21611 Bunion of right foot: Secondary | ICD-10-CM

## 2020-07-03 DIAGNOSIS — M21619 Bunion of unspecified foot: Secondary | ICD-10-CM

## 2020-07-03 NOTE — Patient Instructions (Addendum)
Bunion A bunion (hallux valgus) is a bump that forms slowly on the inner side of the big toe joint. It occurs when the big toe turns toward the second toe. Bunions may be small at first,but they often get larger over time. They can make walking painful. What are the causes? This condition may be caused by: Wearing narrow or pointed shoes that force the big toe to press against the other toes. Abnormal foot development that causes the foot to roll inward. Changes in the foot that are caused by certain diseases, such as rheumatoid arthritis or polio. A foot injury. What increases the risk? The following factors may make you more likely to develop this condition: Wearing shoes that squeeze the toes together. Having certain diseases, such as: Rheumatoid arthritis. Polio. Cerebral palsy. Having family members who have bunions. Being born with abnormally shaped feet (a foot deformity), such as flat feet or low arches. Doing activities that put a lot of pressure on the feet, such as ballet dancing. What are the signs or symptoms?  The main symptom of this condition is a bump on your big toe that you cannotice. Other symptoms may include: Pain. Redness and inflammation around your big toe. Thick or hardened skin on your big toe or between your toes. Stiffness or loss of motion in your big toe. Trouble with walking. How is this diagnosed? This condition may be diagnosed based on your symptoms, medical history, and activities. You may also have tests and imaging, such as: X-rays. These allow your health care provider to check the position of the bones in your foot and look for damage to your joint. They also help your health care provider determine the severity of your bunion and the best way to treat it. Joint aspiration. In this test, a sample of fluid is removed from the toe joint. This test may be done if you are in a lot of pain. It helps rule out diseases that cause painful swelling of the  joints, such as arthritis or gout. How is this treated? Treatment depends on the severity of your symptoms. The goal of treatment is to relieve symptoms and prevent your bunion from getting worse. Your health care provider may recommend: Wearing shoes that have a wide toe box, or using bunion pads to cushion the affected area. Taping your toes together to keep them in a normal position. Placing a device inside your shoe (orthotic device) to help reduce pressure on your toe joint. Taking medicine to ease pain and inflammation. Putting ice or heat on the affected area. Doing stretching exercises. Surgery, for severe cases. Follow these instructions at home: Managing pain, stiffness, and swelling     If directed, put ice on the painful area. To do this: Put ice in a plastic bag. Place a towel between your skin and the bag. Leave the ice on for 20 minutes, 2-3 times a day. Remove the ice if your skin turns bright red. This is very important. If you cannot feel pain, heat, or cold, you have a greater risk of damage to the area. If directed, apply heat to the affected area before you exercise. Use the heat source that your health care provider recommends, such as a moist heat pack or a heating pad. Place a towel between your skin and the heat source. Leave the heat on for 20-30 minutes. Remove the heat if your skin turns bright red. This is especially important if you are unable to feel pain, heat, or cold.   You have a greater risk of getting burned. General instructions Do exercises as told by your health care provider. Support your toe joint with proper footwear, shoe padding, or taping as told by your health care provider. Take over-the-counter and prescription medicines only as told by your health care provider. Do not use any products that contain nicotine or tobacco, such as cigarettes, e-cigarettes, and chewing tobacco. If you need help quitting, ask your health care provider. Keep all  follow-up visits. This is important. Contact a health care provider if: Your symptoms get worse. Your symptoms do not improve in 2 weeks. Get help right away if: You have severe pain and trouble with walking. Summary A bunion is a bump on the inner side of the big toe joint that forms when the big toe turns toward the second toe. Bunions can make walking painful. Treatment depends on the severity of your symptoms. Support your toe joint with proper footwear, shoe padding, or taping as told by your health care provider. This information is not intended to replace advice given to you by your health care provider. Make sure you discuss any questions you have with your healthcare provider. Document Revised: 05/05/2019 Document Reviewed: 05/05/2019 Elsevier Patient Education  2022 [employer]  or feet in the solution and soak for 20 minutes.  This soak should be done twice a day.  Next, remove your foot or feet from solution, blot dry the affected area. Apply ointment and cover if instructed by your doctor.   IF YOUR SKIN BECOMES IRRITATED WHILE USING THESE INSTRUCTIONS, IT IS OKAY TO SWITCH TO  WHITE VINEGAR AND WATER.  As another alternative soak, you may use antibacterial soap and water.  Monitor for any signs/symptoms of infection. Call the office immediately if any occur or go directly to the emergency room. Call with any questions/concerns.

## 2020-07-07 NOTE — Progress Notes (Signed)
Subjective:   Patient ID: Daisy Page, female   DOB: 70 y.o.   MRN: [id number]   HPI Patient presents with structural bunion deformity bilateral and pain in the outside of the foot bilateral.  States this is been ongoing she is tried wider shoes she is tried trimming and soaks without relief of symptoms with gradual increase in her pain.  Patient is found to have good exercise routine and does not smoke   Review of Systems  All other systems reviewed and are negative.      Objective:  Physical Exam Vitals and nursing note reviewed.  Constitutional:      Appearance: She is well-developed.  Pulmonary:     Effort: Pulmonary effort is normal.  Musculoskeletal:        General: Normal range of motion.  Skin:    General: Skin is warm.  Neurological:     Mental Status: She is alert.    Neurovascular status found to be intact muscle strength was found to be adequate range of motion adequate with moderate structural deformity left over right foot redness around the first metatarsal head and fifth metatarsal head.  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  I explained this in great detail today going over all possible issues and recovery and she will reappoint in the next 10 days and tentatively will be scheduled for surgery in July with all questions to be answered  X-rays indicate significant structural deformity left over right foot and tailor's bunion deformity bilateral

## 2020-07-11 ENCOUNTER — Encounter: Payer: Self-pay | Admitting: Podiatry

## 2020-07-11 ENCOUNTER — Ambulatory Visit (INDEPENDENT_AMBULATORY_CARE_PROVIDER_SITE_OTHER): Payer: Medicare Other | Admitting: Podiatry

## 2020-07-11 ENCOUNTER — Other Ambulatory Visit: Payer: Self-pay

## 2020-07-11 DIAGNOSIS — M21622 Bunionette of left foot: Secondary | ICD-10-CM | POA: Diagnosis not present

## 2020-07-11 DIAGNOSIS — M21612 Bunion of left foot: Secondary | ICD-10-CM | POA: Diagnosis not present

## 2020-07-11 DIAGNOSIS — M21619 Bunion of unspecified foot: Secondary | ICD-10-CM

## 2020-07-11 DIAGNOSIS — M21621 Bunionette of right foot: Secondary | ICD-10-CM | POA: Diagnosis not present

## 2020-07-11 NOTE — Progress Notes (Signed)
Subjective:   Patient ID: Daisy Page, female   DOB: 70 y.o.   MRN: [id number]   HPI Patient presents stating she is ready to get her feet fixed and wants to do them approximately 6 weeks apart as they both really bother her.  We will get a do the left foot first and she has chronic bunion deformity on both that are painful and chronic tailor's bunion deformity both that are painful   ROS      Objective:  Physical Exam  Neurovascular status intact negative Bevelyn Buckles' sign noted good digital perfusion noted with significant structural deformity first metatarsal left and fifth metatarsal left and first metatarsal right fifth metatarsal right.  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  I did review with her the surgery and reviewed with her the recovery and the fact total recovery takes approximately 6 months and all possible complications as outlined or possible.  She is comfortable with this signed consent form and today I did dispense air fracture walker with all instructions on usage and I advised her to get used to it prior to surgery and she is scheduled with her date and met with our department for this and is encouraged to call with questions concerns

## 2020-07-22 MED ORDER — OXYCODONE-ACETAMINOPHEN 10-325 MG PO TABS: 1.0000 | ORAL_TABLET | ORAL | 0 refills | Status: DC | PRN

## 2020-07-22 MED ORDER — ONDANSETRON HCL 4 MG PO TABS: 4.0000 mg | ORAL_TABLET | Freq: Three times a day (TID) | ORAL | 0 refills | Status: AC | PRN

## 2020-07-22 NOTE — Addendum Note (Signed)
Addended by: Lenn Sink on: 07/22/2020 01:32 PM   Modules accepted: Orders

## 2020-07-23 ENCOUNTER — Encounter: Payer: Self-pay | Admitting: Podiatry

## 2020-07-23 DIAGNOSIS — M21612 Bunion of left foot: Secondary | ICD-10-CM | POA: Diagnosis not present

## 2020-07-23 DIAGNOSIS — M2012 Hallux valgus (acquired), left foot: Secondary | ICD-10-CM | POA: Diagnosis not present

## 2020-07-25 ENCOUNTER — Telehealth: Payer: Self-pay

## 2020-07-29 ENCOUNTER — Other Ambulatory Visit: Payer: Self-pay

## 2020-07-29 ENCOUNTER — Encounter: Payer: Self-pay | Admitting: Podiatry

## 2020-07-29 ENCOUNTER — Ambulatory Visit (INDEPENDENT_AMBULATORY_CARE_PROVIDER_SITE_OTHER): Payer: Medicare Other

## 2020-07-29 ENCOUNTER — Ambulatory Visit (INDEPENDENT_AMBULATORY_CARE_PROVIDER_SITE_OTHER): Payer: Medicare Other | Admitting: Podiatry

## 2020-07-29 DIAGNOSIS — Z9889 Other specified postprocedural states: Secondary | ICD-10-CM

## 2020-07-30 NOTE — Progress Notes (Signed)
Subjective:   Patient ID: Daisy Page, female   DOB: 70 y.o.   MRN: [id number]   HPI Patient states doing well with surgery and would like to get the other foot fixed in the next 6 weeks.  States she is walking on the boot and is not having significant discomfort   ROS      Objective:  Physical Exam  Neurovascular status intact negative Homans' sign noted left foot healing well wound edges well coapted first and fifth metatarsal with good alignment noted and range of motion     Assessment:  Doing well post osteotomies first metatarsal left foot and fifth metatarsal     Plan:  H&P x-ray reviewed sterile dressing reapplied continue elevation compression immobilization continue boot usage and also surgical shoe usage  X-rays indicate osteotomies are healing fixation intact with stress on the fifth metatarsal but I am confident will heal uneventfully with continued immobilization

## 2020-08-21 ENCOUNTER — Ambulatory Visit (INDEPENDENT_AMBULATORY_CARE_PROVIDER_SITE_OTHER): Payer: Medicare Other

## 2020-08-21 ENCOUNTER — Other Ambulatory Visit: Payer: Self-pay

## 2020-08-21 ENCOUNTER — Encounter: Payer: Self-pay | Admitting: Podiatry

## 2020-08-21 ENCOUNTER — Ambulatory Visit (INDEPENDENT_AMBULATORY_CARE_PROVIDER_SITE_OTHER): Payer: Medicare Other | Admitting: Podiatry

## 2020-08-21 DIAGNOSIS — Z9889 Other specified postprocedural states: Secondary | ICD-10-CM

## 2020-08-21 DIAGNOSIS — M21611 Bunion of right foot: Secondary | ICD-10-CM

## 2020-08-22 NOTE — Progress Notes (Signed)
Subjective:   Patient ID: Daisy Page, female   DOB: 70 y.o.   MRN: [id number]   HPI Patient presents stating doing well with the left foot with mild swelling and ready to get the right foot fixed as soon as possible   ROS      Objective:  Physical Exam  Neurovascular status left intact negative Denna Haggard' sign noted wound edges coapted well mild edema around the first metatarsal left with good range of motion no crepitus noted with significant structural bunion deformity tailor's bunion deformity right     Assessment:  Doing well post surgical procedures left with mild swelling consistent with this.  Postop with significant structural deformity right     Plan:  H&P x-rays left reviewed and went ahead continue with compression and immobilization and elevation.  For the right I did discuss structural bunion and tailor's bunion correction and I allowed her to read consent form going over all possible complications and risks associated with surgery and that just because 1 foot did so well no guarantee on the second foot.  Patient wants surgery signed consent form after extensive review scheduled for outpatient surgery encouraged to call with questions concerns prior to procedure  X-rays indicate the osteotomies are healing well slight stress on the left first metatarsal with the proximal pin having moved slightly dorsal and may need to be removed later if it is symptomatic.  Continue immobilization associated with the x-ray but overall should heal uneventfully

## 2020-08-26 NOTE — Telephone Encounter (Signed)
POC

## 2020-08-27 DIAGNOSIS — N3946 Mixed incontinence: Secondary | ICD-10-CM | POA: Diagnosis not present

## 2020-08-27 DIAGNOSIS — R3915 Urgency of urination: Secondary | ICD-10-CM | POA: Diagnosis not present

## 2020-08-27 DIAGNOSIS — R350 Frequency of micturition: Secondary | ICD-10-CM | POA: Diagnosis not present

## 2020-09-02 MED ORDER — OXYCODONE-ACETAMINOPHEN 10-325 MG PO TABS: 1.0000 | ORAL_TABLET | ORAL | 0 refills | Status: DC | PRN

## 2020-09-02 MED ORDER — ONDANSETRON HCL 4 MG PO TABS: 4.0000 mg | ORAL_TABLET | Freq: Three times a day (TID) | ORAL | 0 refills | Status: DC | PRN

## 2020-09-02 NOTE — Addendum Note (Signed)
Addended by: Lenn Sink on: 09/02/2020 05:42 PM   Modules accepted: Orders

## 2020-09-03 ENCOUNTER — Encounter: Payer: Self-pay | Admitting: Podiatry

## 2020-09-03 DIAGNOSIS — M21621 Bunionette of right foot: Secondary | ICD-10-CM | POA: Diagnosis not present

## 2020-09-03 DIAGNOSIS — M2011 Hallux valgus (acquired), right foot: Secondary | ICD-10-CM | POA: Diagnosis not present

## 2020-09-03 DIAGNOSIS — M2012 Hallux valgus (acquired), left foot: Secondary | ICD-10-CM | POA: Diagnosis not present

## 2020-09-05 ENCOUNTER — Ambulatory Visit: Payer: Medicare Other

## 2020-09-06 ENCOUNTER — Other Ambulatory Visit: Payer: Self-pay

## 2020-09-06 DIAGNOSIS — I83899 Varicose veins of unspecified lower extremities with other complications: Secondary | ICD-10-CM

## 2020-09-11 ENCOUNTER — Other Ambulatory Visit: Payer: Self-pay

## 2020-09-11 ENCOUNTER — Ambulatory Visit (INDEPENDENT_AMBULATORY_CARE_PROVIDER_SITE_OTHER): Payer: Medicare Other

## 2020-09-11 ENCOUNTER — Encounter: Payer: Self-pay | Admitting: Podiatry

## 2020-09-11 ENCOUNTER — Encounter: Payer: Medicare Other | Admitting: Podiatry

## 2020-09-11 ENCOUNTER — Ambulatory Visit (INDEPENDENT_AMBULATORY_CARE_PROVIDER_SITE_OTHER): Payer: Medicare Other | Admitting: Podiatry

## 2020-09-11 DIAGNOSIS — Z9889 Other specified postprocedural states: Secondary | ICD-10-CM

## 2020-09-11 NOTE — Progress Notes (Signed)
Subjective:   Patient ID: Daisy Page, female   DOB: 70 y.o.   MRN: [id number]   HPI Patient states doing well with surgery but is swelling and having more pain on this foot than the other foot stating the other foot is doing well neuro   ROS      Objective:  Physical Exam  Muscular status intact negative Denna Haggard' sign noted wound edges well coapted first MPJ in good alignment with no drainage noted and good motion of the joint when tested.  Patient is found to have good digital perfusion at this time     Assessment:  Overall doing well after surgery right with moderate inflammatory changes still present secondary to surgery     Plan:  H&P x-ray reviewed discussed importance of continued elevation compression immobilization and reappoint for [place] to recheck again in the next few weeks with x-rays reviewed today and sterile dressing reapplied  X-rays indicate osteotomies healing well fixation in place good reduction of deformity right foot

## 2020-09-17 ENCOUNTER — Encounter (HOSPITAL_COMMUNITY): Payer: Medicare Other

## 2020-09-20 ENCOUNTER — Ambulatory Visit
Admission: RE | Admit: 2020-09-20 | Discharge: 2020-09-20 | Disposition: A | Discharge status: Home / Self Care | Payer: Medicare Other | Source: Ambulatory Visit | Attending: Family Medicine | Admitting: Family Medicine

## 2020-09-20 ENCOUNTER — Other Ambulatory Visit: Payer: Self-pay

## 2020-09-20 DIAGNOSIS — Z1231 Encounter for screening mammogram for malignant neoplasm of breast: Secondary | ICD-10-CM

## 2020-09-23 DIAGNOSIS — R3915 Urgency of urination: Secondary | ICD-10-CM | POA: Diagnosis not present

## 2020-09-23 DIAGNOSIS — I868 Varicose veins of other specified sites: Secondary | ICD-10-CM | POA: Diagnosis not present

## 2020-09-23 DIAGNOSIS — E781 Pure hyperglyceridemia: Secondary | ICD-10-CM | POA: Diagnosis not present

## 2020-09-23 DIAGNOSIS — Z0000 Encounter for general adult medical examination without abnormal findings: Secondary | ICD-10-CM | POA: Diagnosis not present

## 2020-09-23 DIAGNOSIS — R7309 Other abnormal glucose: Secondary | ICD-10-CM | POA: Diagnosis not present

## 2020-09-23 DIAGNOSIS — M2010 Hallux valgus (acquired), unspecified foot: Secondary | ICD-10-CM | POA: Diagnosis not present

## 2020-09-23 DIAGNOSIS — E039 Hypothyroidism, unspecified: Secondary | ICD-10-CM | POA: Diagnosis not present

## 2020-09-25 ENCOUNTER — Other Ambulatory Visit: Payer: Self-pay | Admitting: Family Medicine

## 2020-09-25 DIAGNOSIS — R928 Other abnormal and inconclusive findings on diagnostic imaging of breast: Secondary | ICD-10-CM

## 2020-10-01 DIAGNOSIS — M62838 Other muscle spasm: Secondary | ICD-10-CM | POA: Diagnosis not present

## 2020-10-01 DIAGNOSIS — R3915 Urgency of urination: Secondary | ICD-10-CM | POA: Diagnosis not present

## 2020-10-01 DIAGNOSIS — N3946 Mixed incontinence: Secondary | ICD-10-CM | POA: Diagnosis not present

## 2020-10-01 DIAGNOSIS — M6289 Other specified disorders of muscle: Secondary | ICD-10-CM | POA: Diagnosis not present

## 2020-10-01 DIAGNOSIS — R350 Frequency of micturition: Secondary | ICD-10-CM | POA: Diagnosis not present

## 2020-10-01 DIAGNOSIS — M6281 Muscle weakness (generalized): Secondary | ICD-10-CM | POA: Diagnosis not present

## 2020-10-02 ENCOUNTER — Ambulatory Visit (INDEPENDENT_AMBULATORY_CARE_PROVIDER_SITE_OTHER): Payer: Medicare Other | Admitting: Podiatry

## 2020-10-02 ENCOUNTER — Ambulatory Visit (INDEPENDENT_AMBULATORY_CARE_PROVIDER_SITE_OTHER): Payer: Medicare Other

## 2020-10-02 ENCOUNTER — Encounter: Payer: Self-pay | Admitting: Podiatry

## 2020-10-02 ENCOUNTER — Other Ambulatory Visit: Payer: Self-pay

## 2020-10-02 DIAGNOSIS — Z9889 Other specified postprocedural states: Secondary | ICD-10-CM

## 2020-10-03 NOTE — Progress Notes (Signed)
Subjective:   Patient ID: Daisy Page, female   DOB: 70 y.o.   MRN: [id number]   HPI Patient states she is doing well with surgery and states that she still getting some swelling in both her feet but overall continues to improve and is very happy so far with how things are progressing neuro   ROS      Objective:  Physical Exam  Vascular status intact negative Denna Haggard' sign bilateral incision sites healing well swelling more pronounced right over left foot due to the surgery being more recent with good range of motion first MPJ bilateral no crepitus     Assessment:  Doing well with surgery on her feet with normal swelling for this.  Postop incision sites healing well good range of motion     Plan:  H&P x-ray right reviewed and at this point I did discuss slight secondary motion around the first MPJ but it should heal uneventfully and it is just due to the amount of stress on the joint surface.  Patient is very pleased with his skin a gradual return to soft shoe continue elevation compression and that total recovery will take 6 months to 1 year.  Patient is encouraged to call questions concerns which may come up  X-rays indicate that the osteotomy is healing well fixation in place slight movement of the first MPJ but it should be stable at this position with fifth metatarsal healing well

## 2020-10-08 DIAGNOSIS — M6281 Muscle weakness (generalized): Secondary | ICD-10-CM | POA: Diagnosis not present

## 2020-10-08 DIAGNOSIS — R350 Frequency of micturition: Secondary | ICD-10-CM | POA: Diagnosis not present

## 2020-10-08 DIAGNOSIS — R3915 Urgency of urination: Secondary | ICD-10-CM | POA: Diagnosis not present

## 2020-10-08 DIAGNOSIS — N3946 Mixed incontinence: Secondary | ICD-10-CM | POA: Diagnosis not present

## 2020-10-08 DIAGNOSIS — M6289 Other specified disorders of muscle: Secondary | ICD-10-CM | POA: Diagnosis not present

## 2020-10-08 DIAGNOSIS — M62838 Other muscle spasm: Secondary | ICD-10-CM | POA: Diagnosis not present

## 2020-10-11 ENCOUNTER — Ambulatory Visit
Admission: RE | Admit: 2020-10-11 | Discharge: 2020-10-11 | Disposition: A | Discharge status: Home / Self Care | Payer: Medicare Other | Source: Ambulatory Visit | Attending: Family Medicine | Admitting: Family Medicine

## 2020-10-11 ENCOUNTER — Other Ambulatory Visit: Payer: Self-pay

## 2020-10-11 DIAGNOSIS — R922 Inconclusive mammogram: Secondary | ICD-10-CM | POA: Diagnosis not present

## 2020-10-11 DIAGNOSIS — R928 Other abnormal and inconclusive findings on diagnostic imaging of breast: Secondary | ICD-10-CM

## 2020-10-14 DIAGNOSIS — R3915 Urgency of urination: Secondary | ICD-10-CM | POA: Diagnosis not present

## 2020-10-14 DIAGNOSIS — N3946 Mixed incontinence: Secondary | ICD-10-CM | POA: Diagnosis not present

## 2020-10-15 DIAGNOSIS — R350 Frequency of micturition: Secondary | ICD-10-CM | POA: Diagnosis not present

## 2020-10-15 DIAGNOSIS — N3946 Mixed incontinence: Secondary | ICD-10-CM | POA: Diagnosis not present

## 2020-10-15 DIAGNOSIS — R3915 Urgency of urination: Secondary | ICD-10-CM | POA: Diagnosis not present

## 2020-10-15 DIAGNOSIS — M6281 Muscle weakness (generalized): Secondary | ICD-10-CM | POA: Diagnosis not present

## 2020-10-15 DIAGNOSIS — M6289 Other specified disorders of muscle: Secondary | ICD-10-CM | POA: Diagnosis not present

## 2020-10-15 DIAGNOSIS — M62838 Other muscle spasm: Secondary | ICD-10-CM | POA: Diagnosis not present

## 2020-10-29 ENCOUNTER — Ambulatory Visit (INDEPENDENT_AMBULATORY_CARE_PROVIDER_SITE_OTHER): Payer: Medicare Other | Admitting: Physician Assistant

## 2020-10-29 ENCOUNTER — Other Ambulatory Visit: Payer: Self-pay

## 2020-10-29 ENCOUNTER — Ambulatory Visit (HOSPITAL_COMMUNITY)
Admission: RE | Admit: 2020-10-29 | Discharge: 2020-10-29 | Disposition: A | Discharge status: Home / Self Care | Payer: Medicare Other | Source: Ambulatory Visit | Attending: Vascular Surgery | Admitting: Vascular Surgery

## 2020-10-29 VITALS — BP 134/74 | HR 82 | Temp 98.0°F | Resp 14 | Ht 61.0 in | Wt 205.7 lb

## 2020-10-29 DIAGNOSIS — I83899 Varicose veins of unspecified lower extremities with other complications: Secondary | ICD-10-CM | POA: Insufficient documentation

## 2020-10-29 DIAGNOSIS — I872 Venous insufficiency (chronic) (peripheral): Secondary | ICD-10-CM | POA: Diagnosis not present

## 2020-10-29 DIAGNOSIS — I83891 Varicose veins of right lower extremities with other complications: Secondary | ICD-10-CM | POA: Diagnosis not present

## 2020-10-29 NOTE — Progress Notes (Signed)
Requested by:  Daisy Floro, MD [address] [place],  [place] [address]  Reason for consultation: Lower extremity varicosities   History of Present Illness   Daisy Page is a 70 y.o. (October 23, 1950) female who presents for evaluation of lateral lower extremity venous varicosities.  The patient states her varicose veins date back to her mid teens.  When she was approximately 70 years old she underwent left lower extremity venous sclerotherapy.  Today she reports heavy aching legs worse when standing or sitting for long periods of time.  She has intermittent BLE swelling. She has recently undergone bilateral bunionectomies.  Her incisions have healed nicely.  She wears short compressive socks for postsurgical swelling.  Does not wear thigh-high compression stockings.  No prior history of DVT.  Venous symptoms include: positive if (X) [  x] aching [ x ] heavy [ x ] tired  [  ] throbbing [  ] burning  [  ] itching [  ]swelling [  ] bleeding [  ] ulcer  Onset/duration:  age 50  Occupation:  retired Aggravating factors: sitting, standing Alleviating factors:  Compression:  ankle Helps:  yes Pain medications:  no Previous vein procedures:  yes History of DVT:  no  No past medical history on file.  Past Surgical History:  Procedure Laterality Date   CHOLECYSTECTOMY N/A 02/19/2018   Procedure: LAPAROSCOPIC CHOLECYSTECTOMY WITH IOC;  Surgeon: Darnell Level, MD;  Location: [facility];  Service: General;  Laterality: N/A;    Social History   Socioeconomic History   Marital status: Unknown    Spouse name: Not on file   Number of children: Not on file   Years of education: Not on file   Highest education level: Not on file  Occupational History   Not on file  Tobacco Use   Smoking status: Never   Smokeless tobacco: Never  Substance and Sexual Activity   Alcohol use: Never   Drug use: Never   Sexual activity: Not Currently  Other Topics Concern   Not on file   Social History Narrative   Not on file   Social Determinants of Health   Financial Resource Strain: Not on file  Food Insecurity: Not on file  Transportation Needs: Not on file  Physical Activity: Not on file  Stress: Not on file  Social Connections: Not on file  Intimate Partner Violence: Not on file    Family History  Problem Relation Age of Onset   Thyroid disease Mother    Diabetes Mother    Diabetes Father    Hypertension Father     Current Outpatient Medications  Medication Sig Dispense Refill   albuterol (VENTOLIN HFA) 108 (90 Base) MCG/ACT inhaler SMARTSIG:1-2 Puff(s) Via Inhaler Every 4 Hours PRN     levothyroxine (SYNTHROID) 88 MCG tablet Take 88 mcg by mouth every morning.     ondansetron (ZOFRAN) 4 MG tablet Take 1 tablet (4 mg total) by mouth every 8 (eight) hours as needed for nausea or vomiting. 20 tablet 0   ondansetron (ZOFRAN) 4 MG tablet Take 1 tablet (4 mg total) by mouth every 8 (eight) hours as needed for nausea or vomiting. 20 tablet 0   oxyCODONE-acetaminophen (PERCOCET) 10-325 MG tablet Take 1 tablet by mouth every 4 (four) hours as needed for pain. 20 tablet 0   oxyCODONE-acetaminophen (PERCOCET) 10-325 MG tablet Take 1 tablet by mouth every 4 (four) hours as needed for pain. 15 tablet 0   traMADol (ULTRAM)  50 MG tablet Take 1-2 tablets (50-100 mg total) by mouth every 6 (six) hours as needed. 15 tablet 0   No current facility-administered medications for this visit.    No Known Allergies  REVIEW OF SYSTEMS (negative unless checked):   Cardiac:  []  Chest pain or chest pressure? []  Shortness of breath upon activity? []  Shortness of breath when lying flat? []  Irregular heart rhythm?  Vascular:  []  Pain in calf, thigh, or hip brought on by walking? []  Pain in feet at night that wakes you up from your sleep? []  Blood clot in your veins? [x]  Leg swelling?  Pulmonary:  []  Oxygen at home? []  Productive cough? []  Wheezing?  Neurologic:  []   Sudden weakness in arms or legs? []  Sudden numbness in arms or legs? []  Sudden onset of difficult speaking or slurred speech? []  Temporary loss of vision in one eye? []  Problems with dizziness?  Gastrointestinal:  []  Blood in stool? []  Vomited blood?  Genitourinary:  []  Burning when urinating? []  Blood in urine?  Psychiatric:  []  Major depression  Hematologic:  []  Bleeding problems? []  Problems with blood clotting?  Dermatologic:  []  Rashes or ulcers?  Constitutional:  []  Fever or chills?  Ear/Nose/Throat:  []  Change in hearing? []  Nose bleeds? []  Sore throat?  Musculoskeletal:  []  Back pain? []  Joint pain? []  Muscle pain?   Physical Examination     Vitals:   10/29/20 1335  BP: 134/74  Pulse: 82  Resp: 14  Temp: 98 F (36.7 C)  TempSrc: Temporal  SpO2: 96%  Weight: 205 lb 11.2 oz (93.3 kg)  Height: 5\' 1"  (1.549 m)   Body mass index is 38.87 kg/m.  General:  WDWN in NAD; vital signs documented above Gait: Not observed HENT: WNL, normocephalic Pulmonary: normal non-labored breathing , without Rales, rhonchi,  wheezing Cardiac: regular HR, without  Murmurs without carotid bruits Skin: without rashes Vascular Exam/Pulses: 2+ right dorsalis pedis, 2+ left AT pulses  Extremities: with varicose veins, without reticular veins, with edema, without stasis pigmentation, without lipodermatosclerosis, without ulcers Musculoskeletal: no muscle wasting or atrophy  Neurologic: A&O X 3;  No focal weakness or paresthesias are detected Psychiatric:  The pt has Normal affect.     Non-invasive Vascular Imaging   RLE Venous Insufficiency Duplex  The patient has reflux at the right saphenofemoral junction.  There is reflux of the proximal, mid, distal greater saphenous vein at the thigh.  There is also reflux of the greater saphenous vein at the knee and proximal calf.  The greater saphenous vein diameter ranges from 4 mm to 5 mm.  Medical Decision Making    [name] is a 70 y.o. female who presents with: Right LE chronic venous insufficiency, right lower extremity varicose veins with complications including reflux of the greater saphenous vein on the right with dilatation. There is no evidence of deep venous thrombosis or arterial insufficiency.  Based on the patient's history and examination, I recommend: Healthy vein guidelines.  Patient is given written information in regards to this. We discussed findings and possible EVLA.  I discussed with the patient the use of her 20-30 mm thigh high compression stockings and need for 3 month trial of such. She purchased these today. The patient will follow up in 3 months with Dr. . She would also like to schedule reflux study of her LLE. Thank you for allowing to participate in this patient's care.   , PA-C [facility] [place] Office: [phone]  10/29/2020, 1:38 PM  Clinic MD: Dr. Lenell Antu

## 2020-11-01 ENCOUNTER — Other Ambulatory Visit: Payer: Self-pay

## 2020-11-01 DIAGNOSIS — I83899 Varicose veins of unspecified lower extremities with other complications: Secondary | ICD-10-CM

## 2020-11-06 DIAGNOSIS — M62838 Other muscle spasm: Secondary | ICD-10-CM | POA: Diagnosis not present

## 2020-11-06 DIAGNOSIS — M6281 Muscle weakness (generalized): Secondary | ICD-10-CM | POA: Diagnosis not present

## 2020-11-06 DIAGNOSIS — N3946 Mixed incontinence: Secondary | ICD-10-CM | POA: Diagnosis not present

## 2020-11-06 DIAGNOSIS — M6289 Other specified disorders of muscle: Secondary | ICD-10-CM | POA: Diagnosis not present

## 2020-11-06 DIAGNOSIS — R3915 Urgency of urination: Secondary | ICD-10-CM | POA: Diagnosis not present

## 2020-11-06 DIAGNOSIS — R350 Frequency of micturition: Secondary | ICD-10-CM | POA: Diagnosis not present

## 2020-12-24 ENCOUNTER — Other Ambulatory Visit: Payer: Self-pay

## 2020-12-24 ENCOUNTER — Ambulatory Visit
Admission: RE | Admit: 2020-12-24 | Discharge: 2020-12-24 | Disposition: A | Discharge status: Home / Self Care | Payer: Medicare Other | Source: Ambulatory Visit | Attending: Family Medicine | Admitting: Family Medicine

## 2020-12-24 DIAGNOSIS — Z780 Asymptomatic menopausal state: Secondary | ICD-10-CM | POA: Diagnosis not present

## 2020-12-24 DIAGNOSIS — M85852 Other specified disorders of bone density and structure, left thigh: Secondary | ICD-10-CM | POA: Diagnosis not present

## 2020-12-24 DIAGNOSIS — E2839 Other primary ovarian failure: Secondary | ICD-10-CM

## 2021-02-10 ENCOUNTER — Encounter (HOSPITAL_COMMUNITY): Payer: Medicare Other

## 2021-02-10 ENCOUNTER — Ambulatory Visit: Payer: Medicare Other | Admitting: Surgery

## 2021-02-19 ENCOUNTER — Encounter: Payer: Self-pay | Admitting: Podiatry

## 2021-02-19 ENCOUNTER — Other Ambulatory Visit: Payer: Self-pay

## 2021-02-19 ENCOUNTER — Ambulatory Visit (INDEPENDENT_AMBULATORY_CARE_PROVIDER_SITE_OTHER): Payer: Medicare Other | Admitting: Podiatry

## 2021-02-19 ENCOUNTER — Ambulatory Visit (INDEPENDENT_AMBULATORY_CARE_PROVIDER_SITE_OTHER): Payer: Medicare Other

## 2021-02-19 DIAGNOSIS — M2041 Other hammer toe(s) (acquired), right foot: Secondary | ICD-10-CM

## 2021-02-19 DIAGNOSIS — S92515A Nondisplaced fracture of proximal phalanx of left lesser toe(s), initial encounter for closed fracture: Secondary | ICD-10-CM

## 2021-02-19 DIAGNOSIS — M2042 Other hammer toe(s) (acquired), left foot: Secondary | ICD-10-CM

## 2021-02-23 NOTE — Progress Notes (Signed)
Subjective:   Patient ID: Daisy Page, female   DOB: 71 y.o.   MRN: [id number]   HPI Patient presents stating I have had problems with my fifth toes of both feet and that the left fifth toe is curling under the fourth toe and making it difficult to wear shoe gear comfortably.  Patient traumatized the toe it is concerned about what may have happened    ROS      Objective:  Physical Exam  Neurovascular status intact with patient found to have swelling of the fifth digit left with fluid buildup around the proximal phalanx joint surface with patient noted to have digital deformities of the fifth bilateral with rotational component noted.  Patient is found to have good digital perfusion well oriented x3     Assessment:  Possibility for fracture of digit 5 left with digital deformities bilateral     Plan:  H&P x-rays reviewed and for the left fifth digit I discussed fracture and treatment and the fact total recovery will probably take 8 to 12 weeks.  Patient will be seen back to recheck is encouraged to call with questions concerns which may arise and I did review how long fractures take.  Possible for arthroplasty at 1 point in future which I made patient aware of  X-rays indicate there is what appears to be a fracture of the fifth digit left foot proximal phalanx not involving the joint surface

## 2021-04-01 DIAGNOSIS — Z20822 Contact with and (suspected) exposure to covid-19: Secondary | ICD-10-CM | POA: Diagnosis not present

## 2021-05-08 DIAGNOSIS — R059 Cough, unspecified: Secondary | ICD-10-CM | POA: Diagnosis not present

## 2021-05-08 DIAGNOSIS — R051 Acute cough: Secondary | ICD-10-CM | POA: Diagnosis not present

## 2021-05-08 DIAGNOSIS — Z20822 Contact with and (suspected) exposure to covid-19: Secondary | ICD-10-CM | POA: Diagnosis not present

## 2021-06-23 DIAGNOSIS — E781 Pure hyperglyceridemia: Secondary | ICD-10-CM | POA: Diagnosis not present

## 2021-06-23 DIAGNOSIS — E039 Hypothyroidism, unspecified: Secondary | ICD-10-CM | POA: Diagnosis not present

## 2021-06-23 DIAGNOSIS — R7309 Other abnormal glucose: Secondary | ICD-10-CM | POA: Diagnosis not present

## 2021-07-01 DIAGNOSIS — E039 Hypothyroidism, unspecified: Secondary | ICD-10-CM | POA: Diagnosis not present

## 2021-07-01 DIAGNOSIS — E781 Pure hyperglyceridemia: Secondary | ICD-10-CM | POA: Diagnosis not present

## 2021-07-01 DIAGNOSIS — M8580 Other specified disorders of bone density and structure, unspecified site: Secondary | ICD-10-CM | POA: Diagnosis not present

## 2021-07-01 DIAGNOSIS — Z0000 Encounter for general adult medical examination without abnormal findings: Secondary | ICD-10-CM | POA: Diagnosis not present

## 2021-07-01 DIAGNOSIS — R7309 Other abnormal glucose: Secondary | ICD-10-CM | POA: Diagnosis not present

## 2021-07-01 DIAGNOSIS — G4710 Hypersomnia, unspecified: Secondary | ICD-10-CM | POA: Diagnosis not present

## 2021-07-01 DIAGNOSIS — R41840 Attention and concentration deficit: Secondary | ICD-10-CM | POA: Diagnosis not present

## 2021-08-04 DIAGNOSIS — G4719 Other hypersomnia: Secondary | ICD-10-CM | POA: Diagnosis not present

## 2021-08-04 DIAGNOSIS — E669 Obesity, unspecified: Secondary | ICD-10-CM | POA: Diagnosis not present

## 2022-07-13 DIAGNOSIS — E781 Pure hyperglyceridemia: Secondary | ICD-10-CM | POA: Diagnosis not present

## 2022-07-13 DIAGNOSIS — R7309 Other abnormal glucose: Secondary | ICD-10-CM | POA: Diagnosis not present

## 2022-07-13 DIAGNOSIS — E039 Hypothyroidism, unspecified: Secondary | ICD-10-CM | POA: Diagnosis not present

## 2022-07-20 DIAGNOSIS — R7309 Other abnormal glucose: Secondary | ICD-10-CM | POA: Diagnosis not present

## 2022-07-20 DIAGNOSIS — E781 Pure hyperglyceridemia: Secondary | ICD-10-CM | POA: Diagnosis not present

## 2022-07-20 DIAGNOSIS — Z6841 Body Mass Index (BMI) 40.0 and over, adult: Secondary | ICD-10-CM | POA: Diagnosis not present

## 2022-07-20 DIAGNOSIS — Z0000 Encounter for general adult medical examination without abnormal findings: Secondary | ICD-10-CM | POA: Diagnosis not present

## 2022-07-20 DIAGNOSIS — E039 Hypothyroidism, unspecified: Secondary | ICD-10-CM | POA: Diagnosis not present

## 2023-01-05 ENCOUNTER — Ambulatory Visit (HOSPITAL_BASED_OUTPATIENT_CLINIC_OR_DEPARTMENT_OTHER)
Admission: RE | Admit: 2023-01-05 | Discharge: 2023-01-05 | Disposition: A | Discharge status: Home / Self Care | Payer: Medicare Other | Source: Ambulatory Visit | Attending: Medical | Admitting: Medical

## 2023-01-05 ENCOUNTER — Other Ambulatory Visit (HOSPITAL_BASED_OUTPATIENT_CLINIC_OR_DEPARTMENT_OTHER): Payer: Self-pay | Admitting: Medical

## 2023-01-05 DIAGNOSIS — R52 Pain, unspecified: Secondary | ICD-10-CM | POA: Diagnosis not present

## 2023-01-05 DIAGNOSIS — M25561 Pain in right knee: Secondary | ICD-10-CM | POA: Diagnosis not present

## 2023-02-05 DIAGNOSIS — M25561 Pain in right knee: Secondary | ICD-10-CM | POA: Diagnosis not present

## 2023-06-10 IMAGING — MG MM DIGITAL SCREENING BILAT W/ TOMO AND CAD
6 of 10 series · 6 of 30 positions shown · non-contrast
Comparison: None.

CLINICAL DATA: Screening.

EXAM:
DIGITAL SCREENING BILATERAL MAMMOGRAM WITH TOMOSYNTHESIS AND CAD
TECHNIQUE: Bilateral screening digital craniocaudal and mediolateral oblique
mammograms were obtained. Bilateral screening digital breast
tomosynthesis was performed. The images were evaluated with
computer-aided detection.

[L CC synth-2D]
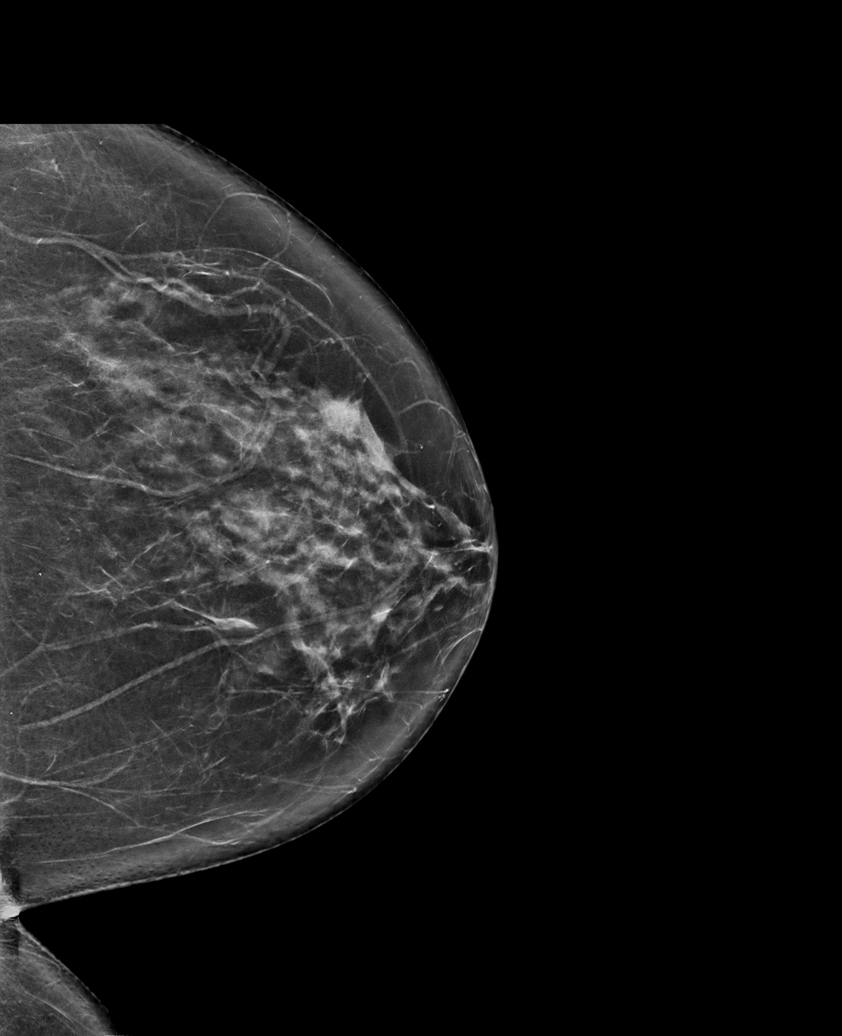

[L MLO synth-2D]
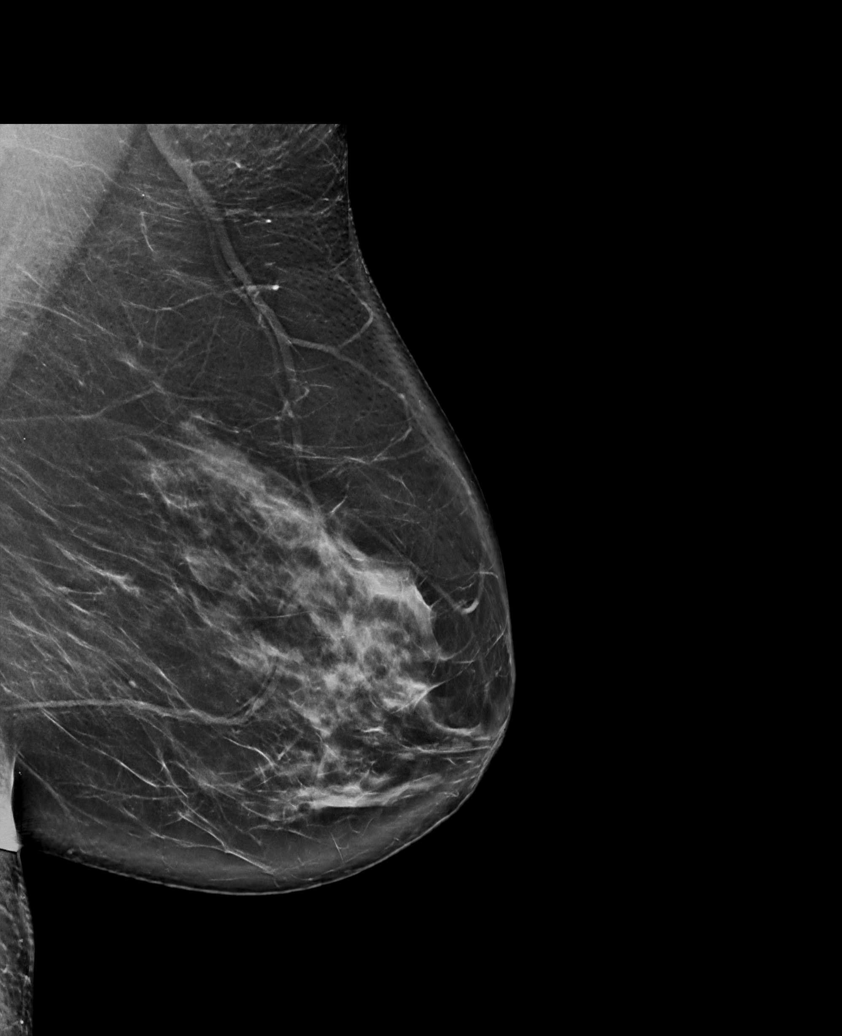

[R MLO synth-2D (1 of 2)]
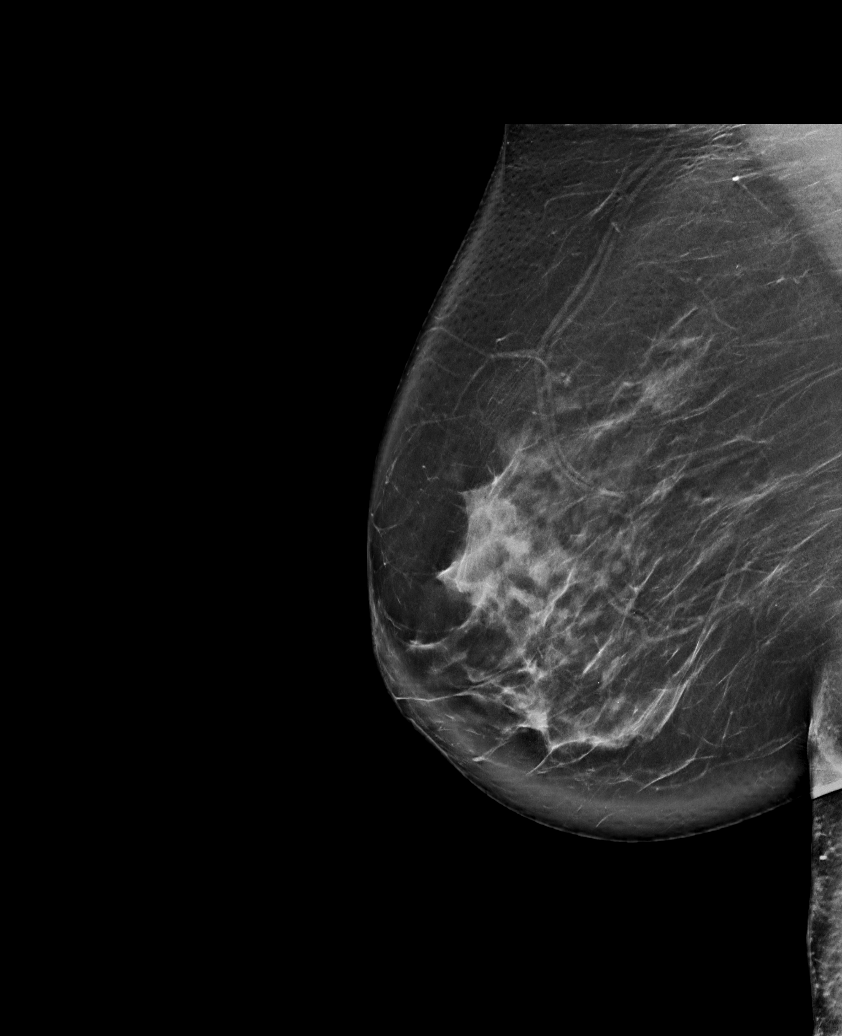

[R CC synth-2D]
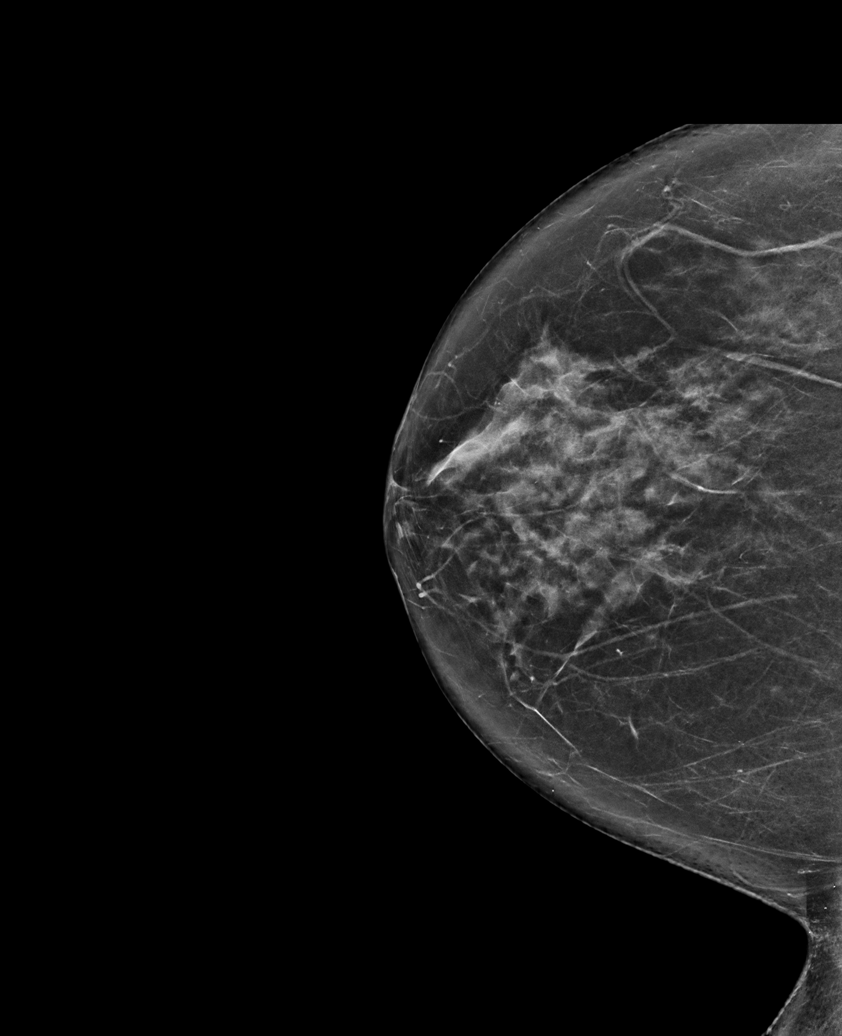

[R MLO synth-2D (2 of 2)]
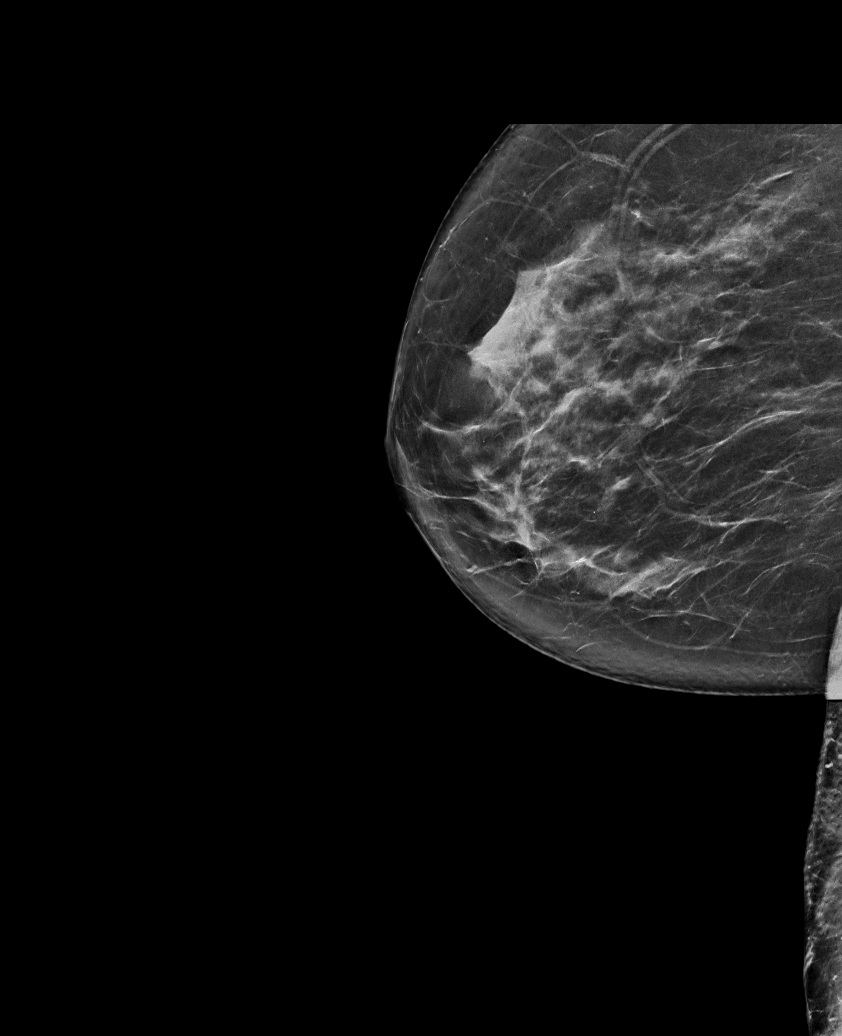

[R CC tomo · tomo slice 37/74.0]
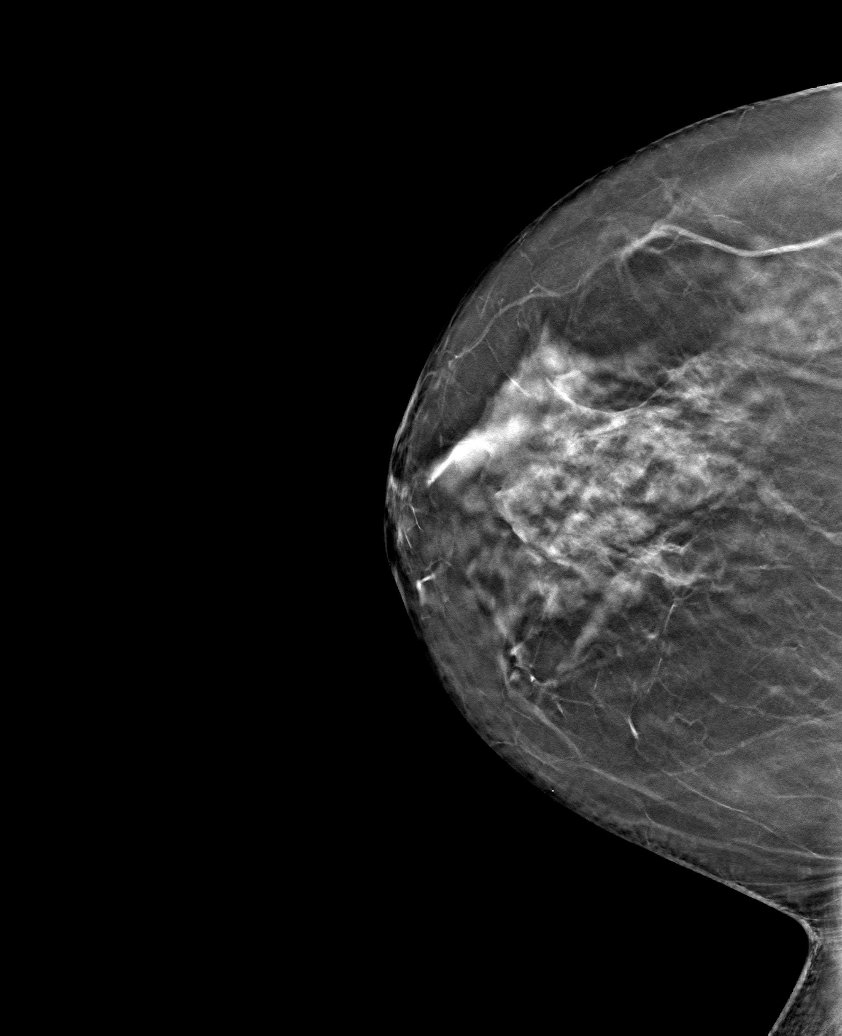

[6 of 30 positions shown; findings below may reference images not displayed]

ACR Breast Density Category c: The breast tissue is heterogeneously
dense, which may obscure small masses.
FINDINGS: In the left breast, a possible asymmetry warrants further
evaluation. In the right breast, no findings suspicious for
malignancy.
IMPRESSION: Further evaluation is suggested for possible asymmetry in the left
breast.

RECOMMENDATION:
Diagnostic mammogram and possibly ultrasound of the left breast.
(Code:M5-1-550)

The patient will be contacted regarding the findings, and additional
imaging will be scheduled.

BI-RADS CATEGORY  0: Incomplete. Need additional imaging evaluation
and/or prior mammograms for comparison.

## 2023-07-19 DIAGNOSIS — E781 Pure hyperglyceridemia: Secondary | ICD-10-CM | POA: Diagnosis not present

## 2023-07-19 DIAGNOSIS — R7309 Other abnormal glucose: Secondary | ICD-10-CM | POA: Diagnosis not present

## 2023-07-19 DIAGNOSIS — E039 Hypothyroidism, unspecified: Secondary | ICD-10-CM | POA: Diagnosis not present

## 2023-07-23 DIAGNOSIS — R7303 Prediabetes: Secondary | ICD-10-CM | POA: Diagnosis not present

## 2023-07-23 DIAGNOSIS — Z0000 Encounter for general adult medical examination without abnormal findings: Secondary | ICD-10-CM | POA: Diagnosis not present

## 2023-07-23 DIAGNOSIS — Z6841 Body Mass Index (BMI) 40.0 and over, adult: Secondary | ICD-10-CM | POA: Diagnosis not present

## 2023-07-23 DIAGNOSIS — E781 Pure hyperglyceridemia: Secondary | ICD-10-CM | POA: Diagnosis not present

## 2023-07-23 DIAGNOSIS — E039 Hypothyroidism, unspecified: Secondary | ICD-10-CM | POA: Diagnosis not present

## 2023-10-28 DIAGNOSIS — R7303 Prediabetes: Secondary | ICD-10-CM | POA: Diagnosis not present

## 2023-10-28 DIAGNOSIS — E039 Hypothyroidism, unspecified: Secondary | ICD-10-CM | POA: Diagnosis not present

## 2023-11-12 DIAGNOSIS — Z1231 Encounter for screening mammogram for malignant neoplasm of breast: Secondary | ICD-10-CM | POA: Diagnosis not present
